# Patient Record
Sex: Male | Born: 1935 | Race: White | Hispanic: No | Smoking: Never smoker
Health system: Southern US, Community
[De-identification: ages and names within clinical notes are randomized; demographics above are authoritative.]

## PROBLEM LIST (undated history)

## (undated) DIAGNOSIS — I5022 Chronic systolic (congestive) heart failure: Secondary | ICD-10-CM

## (undated) DIAGNOSIS — I428 Other cardiomyopathies: Secondary | ICD-10-CM

## (undated) DIAGNOSIS — F32A Depression, unspecified: Secondary | ICD-10-CM

## (undated) DIAGNOSIS — E669 Obesity, unspecified: Secondary | ICD-10-CM

## (undated) DIAGNOSIS — M1990 Unspecified osteoarthritis, unspecified site: Secondary | ICD-10-CM

## (undated) DIAGNOSIS — T82110A Breakdown (mechanical) of cardiac electrode, initial encounter: Secondary | ICD-10-CM

## (undated) DIAGNOSIS — K2211 Ulcer of esophagus with bleeding: Secondary | ICD-10-CM

## (undated) DIAGNOSIS — K219 Gastro-esophageal reflux disease without esophagitis: Secondary | ICD-10-CM

## (undated) DIAGNOSIS — D126 Benign neoplasm of colon, unspecified: Secondary | ICD-10-CM

## (undated) DIAGNOSIS — D3610 Benign neoplasm of peripheral nerves and autonomic nervous system, unspecified: Secondary | ICD-10-CM

## (undated) DIAGNOSIS — E662 Morbid (severe) obesity with alveolar hypoventilation: Secondary | ICD-10-CM

## (undated) DIAGNOSIS — K269 Duodenal ulcer, unspecified as acute or chronic, without hemorrhage or perforation: Secondary | ICD-10-CM

## (undated) DIAGNOSIS — F329 Major depressive disorder, single episode, unspecified: Secondary | ICD-10-CM

## (undated) HISTORY — DX: Morbid (severe) obesity with alveolar hypoventilation: E66.2

## (undated) HISTORY — DX: Major depressive disorder, single episode, unspecified: F32.9

## (undated) HISTORY — DX: Chronic systolic (congestive) heart failure: I50.22

## (undated) HISTORY — PX: CARDIAC DEFIBRILLATOR PLACEMENT: SHX171

## (undated) HISTORY — DX: Gastro-esophageal reflux disease without esophagitis: K21.9

## (undated) HISTORY — PX: OTHER SURGICAL HISTORY: SHX169

## (undated) HISTORY — DX: Depression, unspecified: F32.A

## (undated) HISTORY — DX: Benign neoplasm of peripheral nerves and autonomic nervous system, unspecified: D36.10

## (undated) HISTORY — DX: Unspecified osteoarthritis, unspecified site: M19.90

## (undated) HISTORY — DX: Obesity, unspecified: E66.9

## (undated) HISTORY — DX: Breakdown (mechanical) of cardiac electrode, initial encounter: T82.110A

## (undated) HISTORY — DX: Other cardiomyopathies: I42.8

---
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
## 2002-02-04 DIAGNOSIS — D126 Benign neoplasm of colon, unspecified: Secondary | ICD-10-CM

## 2002-02-04 HISTORY — DX: Benign neoplasm of colon, unspecified: D12.6

## 2002-07-26 ENCOUNTER — Encounter: Payer: Self-pay | Admitting: Urology

## 2002-07-26 ENCOUNTER — Ambulatory Visit (HOSPITAL_BASED_OUTPATIENT_CLINIC_OR_DEPARTMENT_OTHER): Admission: RE | Admit: 2002-07-26 | Discharge: 2002-07-26 | Payer: Self-pay | Admitting: Urology

## 2003-03-10 ENCOUNTER — Ambulatory Visit (HOSPITAL_COMMUNITY): Admission: RE | Admit: 2003-03-10 | Discharge: 2003-03-10 | Payer: Self-pay | Admitting: Internal Medicine

## 2004-01-31 ENCOUNTER — Ambulatory Visit (HOSPITAL_COMMUNITY): Admission: RE | Admit: 2004-01-31 | Discharge: 2004-01-31 | Payer: Self-pay | Admitting: *Deleted

## 2004-06-27 ENCOUNTER — Ambulatory Visit: Payer: Self-pay | Admitting: Internal Medicine

## 2005-09-13 ENCOUNTER — Inpatient Hospital Stay (HOSPITAL_COMMUNITY): Admission: RE | Admit: 2005-09-13 | Discharge: 2005-09-15 | Payer: Self-pay | Admitting: Cardiology

## 2007-12-07 ENCOUNTER — Inpatient Hospital Stay (HOSPITAL_COMMUNITY): Admission: RE | Admit: 2007-12-07 | Discharge: 2007-12-08 | Payer: Self-pay | Admitting: Cardiology

## 2010-02-24 ENCOUNTER — Encounter: Payer: Self-pay | Admitting: Internal Medicine

## 2010-03-01 ENCOUNTER — Observation Stay (HOSPITAL_COMMUNITY)
Admission: EM | Admit: 2010-03-01 | Discharge: 2010-03-03 | Payer: Self-pay | Source: Home / Self Care | Attending: Internal Medicine | Admitting: Internal Medicine

## 2010-03-01 LAB — COMPREHENSIVE METABOLIC PANEL
ALT: 24 U/L (ref 0–53)
AST: 23 U/L (ref 0–37)
Albumin: 3.5 g/dL (ref 3.5–5.2)
Alkaline Phosphatase: 154 U/L — ABNORMAL HIGH (ref 39–117)
BUN: 16 mg/dL (ref 6–23)
CO2: 25 mEq/L (ref 19–32)
Calcium: 8.3 mg/dL — ABNORMAL LOW (ref 8.4–10.5)
Chloride: 102 mEq/L (ref 96–112)
Creatinine, Ser: 0.89 mg/dL (ref 0.4–1.5)
GFR calc Af Amer: >60 mL/min (ref >60)
GFR calc non Af Amer: >60 mL/min (ref >60)
Glucose, Bld: 122 mg/dL — ABNORMAL HIGH (ref 70–99)
Potassium: 4.2 mEq/L (ref 3.5–5.1)
Sodium: 136 mEq/L (ref 135–145)
Total Bilirubin: 1.6 mg/dL — ABNORMAL HIGH (ref 0.3–1.2)
Total Protein: 6.7 g/dL (ref 6.0–8.3)

## 2010-03-01 LAB — CARDIAC PANEL(CRET KIN+CKTOT+MB+TROPI)
CK, MB: 1.3 ng/mL (ref 0.3–4.0)
Relative Index: INVALID (ref 0.0–2.5)
Total CK: 66 U/L (ref 7–232)
Troponin I: 0.01 ng/mL (ref 0.00–0.06)

## 2010-03-01 LAB — URINE MICROSCOPIC-ADD ON

## 2010-03-01 LAB — CK TOTAL AND CKMB (NOT AT ARMC)
CK, MB: 1.7 ng/mL (ref 0.3–4.0)
Relative Index: INVALID (ref 0.0–2.5)
Relative Index: INVALID (ref 0.0–2.5)
Total CK: 43 U/L (ref 7–232)
Total CK: 42 U/L (ref 7–232)

## 2010-03-01 LAB — CBC
HCT: 43.9 % (ref 39.0–52.0)
Hemoglobin: 14.7 g/dL (ref 13.0–17.0)
MCH: 29.9 pg (ref 26.0–34.0)
MCV: 89.2 fL (ref 78.0–100.0)
Platelets: 214 10*3/uL (ref 150–400)
RBC: 4.92 MIL/uL (ref 4.22–5.81)
RDW: 13.7 % (ref 11.5–15.5)
WBC: 11.2 10*3/uL — ABNORMAL HIGH (ref 4.0–10.5)

## 2010-03-01 LAB — URINALYSIS, ROUTINE W REFLEX MICROSCOPIC
Bilirubin Urine: NEGATIVE
Leukocytes, UA: NEGATIVE
Nitrite: NEGATIVE
Protein, ur: NEGATIVE mg/dL
Specific Gravity, Urine: 1.021 (ref 1.005–1.030)
Urine Glucose, Fasting: NEGATIVE mg/dL
Urobilinogen, UA: 2 mg/dL — ABNORMAL HIGH (ref 0.0–1.0)
pH: 7 (ref 5.0–8.0)

## 2010-03-01 LAB — DIFFERENTIAL
Basophils Absolute: 0 10*3/uL (ref 0.0–0.1)
Basophils Relative: 0 % (ref 0–1)
Eosinophils Absolute: 0.1 10*3/uL (ref 0.0–0.7)
Eosinophils Relative: 1 % (ref 0–5)
Lymphocytes Relative: 8 % — ABNORMAL LOW (ref 12–46)
Monocytes Absolute: 0.6 10*3/uL (ref 0.1–1.0)
Monocytes Relative: 6 % (ref 3–12)
Neutro Abs: 9.6 10*3/uL — ABNORMAL HIGH (ref 1.7–7.7)
Neutrophils Relative %: 86 % — ABNORMAL HIGH (ref 43–77)

## 2010-03-01 LAB — TROPONIN I
Troponin I: 0.02 ng/mL (ref 0.00–0.06)
Troponin I: 0.02 ng/mL (ref 0.00–0.06)

## 2010-03-01 LAB — BRAIN NATRIURETIC PEPTIDE: Pro B Natriuretic peptide (BNP): 320 pg/mL — ABNORMAL HIGH (ref 0.0–100.0)

## 2010-03-02 LAB — COMPREHENSIVE METABOLIC PANEL
ALT: 20 U/L (ref 0–53)
AST: 18 U/L (ref 0–37)
Alkaline Phosphatase: 132 U/L — ABNORMAL HIGH (ref 39–117)
BUN: 14 mg/dL (ref 6–23)
CO2: 26 mEq/L (ref 19–32)
Calcium: 8.2 mg/dL — ABNORMAL LOW (ref 8.4–10.5)
Chloride: 96 mEq/L (ref 96–112)
Creatinine, Ser: 0.89 mg/dL (ref 0.4–1.5)
GFR calc Af Amer: >60 mL/min (ref >60)
GFR calc non Af Amer: >60 mL/min (ref >60)
Glucose, Bld: 100 mg/dL — ABNORMAL HIGH (ref 70–99)
Potassium: 3.8 mEq/L (ref 3.5–5.1)
Sodium: 135 mEq/L (ref 135–145)
Total Bilirubin: 2.4 mg/dL — ABNORMAL HIGH (ref 0.3–1.2)
Total Protein: 6.6 g/dL (ref 6.0–8.3)

## 2010-03-02 LAB — URINE CULTURE
Colony Count: NO GROWTH
Culture  Setup Time: 201201261537
Culture: NO GROWTH

## 2010-03-02 LAB — CBC
HCT: 42.7 % (ref 39.0–52.0)
Hemoglobin: 14.3 g/dL (ref 13.0–17.0)
MCH: 29.5 pg (ref 26.0–34.0)
MCHC: 33.5 g/dL (ref 30.0–36.0)
MCV: 88.2 fL (ref 78.0–100.0)
RBC: 4.84 MIL/uL (ref 4.22–5.81)
RDW: 13.6 % (ref 11.5–15.5)
WBC: 10.5 10*3/uL (ref 4.0–10.5)

## 2010-03-02 LAB — CARDIAC PANEL(CRET KIN+CKTOT+MB+TROPI)
CK, MB: 1.2 ng/mL (ref 0.3–4.0)
Relative Index: 1.2 (ref 0.0–2.5)
Total CK: 103 U/L (ref 7–232)
Troponin I: 0.01 ng/mL (ref 0.00–0.06)

## 2010-03-02 NOTE — H&P (Signed)
[name], [name]             ACCOUNT NO.:  [id number]  MEDICAL RECORD NO.:  [id number]          PATIENT TYPE:  EMS  LOCATION:  MAJO                         FACILITY:  [facility]  PHYSICIAN:  Edythe Lynn, M.D.       DATE OF BIRTH:  1933/11/08  DATE OF ADMISSION:  03/01/2010 DATE OF DISCHARGE:                             HISTORY & PHYSICAL   PRIMARY CARE PHYSICIAN:  Janifer Adie, M.D.  PRIMARY CARDIOLOGIST:  Barnett Abu, M.D.  CHIEF COMPLAINT:  Abdominal pain and chest pain.  HISTORY OF PRESENT ILLNESS:  [name] is a 77 year old gentleman with a history of nonischemic cardiomyopathy and myasthenia gravis on chronic cyclosporine, who presented to the emergency room today with the complaints of ongoing chest pain and abdominal pain.  He says the symptoms have started a few days back.  He is a little bit vague about the timing of the symptoms.  He does says his abdominal pain is about 10/10 diffuse in his abdomen and it has been last now for 4 days. Today, he was having more chest pain in the retrosternal area, epigastric area that currently  respond to nitroglycerin in the emergency room.  He has had a cardiac catheterization in 2005 that basically showed clean coronaries.  He currently says that the chest pain is pretty much subsided, but he still has a lot of abdominal discomfort.  He does not think it is cancer or constipation.  His only surgical procedure was appendectomy as a child.  PAST MEDICAL HISTORY: 1. Obesity. 2. Nonischemic cardiomyopathy, ejection fraction 15-20%. 3. Chronic systolic congestive heart failure, status post     biventricular pacer with AICD in August 2007. 4. Myasthenia gravis. 5. Left bundle branch block.  SOCIAL HISTORY:  The patient is a widower.  His son is a physician in [place], his phone number is [phone].  The patient does smoke and does not drink alcohol, is independent, lives all by himself here  in [place].  FAMILY HISTORY:  Positive for heart disease.  REVIEW OF SYSTEMS:  As per HPI.  The patient denies nausea, vomiting, or diarrhea.  He denies fevers and chills.  Denies headaches, focal weakness, or numbness.  Otherwise per HPI or negative.  PHYSICAL EXAMINATION:  VITAL SIGNS:  Upon admission, temperature is 98.2, blood pressure 117/55, respiration 19, saturation 96% on room air. GENERAL APPEARANCE:  He is one of a moderately obese gentleman.  He does not seem to be in acute distress, sitting in the chair.  He is clammy, diaphoretic with cold extremities. HEENT:  Head is normocephalic, atraumatic.  Eyes, pupils equal, round, and reactive to light and accommodation.  Extraocular movements intact. Throat clear. NECK:  Supple.  No JVD. CHEST:  Clear without wheeze, rhonchi, or crackles. HEART:  Regular rate and rhythm with no murmurs, rubs, or gallops. ABDOMEN:  Obese and soft with diffuse tenderness.  No rebound.  No guarding.  Bowel sounds are diminished. EXTREMITIES:  Lower extremities without edema. SKIN:  Pale and clammy.  LABORATORY DATA:  On admission, white blood cell count is 11.2, hemoglobin 14.7, and platelet count 214.  CK 43, troponin-I  0.02. Sodium 136, potassium 4.2, chloride 102, bicarbonate 25, BUN 16, creatinine 0.8, and glucose 122.  AST 23 and ALT 24.  Total bilirubin is at 1.6, alkaline phosphatase is 154, lipase is 14.  Urinalysis shows nitrite, leukocyte esterase negative, rare bacteria, 3-6 red blood cells,  and 0-2 white blood cells.  IMAGING STUDIES:  Chest x-ray shows cardiomegaly without edema.  Left basilar atelectasis, small left effusion.  ASSESSMENT AND PLAN:  This is a 77 year old gentleman with a complex past medical history of nonischemic cardiomyopathy, myasthenia gravis on immunosuppressants, presenting with chest pain and abdominal pain of unclear etiology.  His workup so far is not pointing towards a clear explanation for his  problems.  It is obvious that he has not suffered a myocardial infarction with his pain lasting 4 days now and with normal troponins.  My plan is to place him on observation on telemetry unit, continue to cycle a couple of more cardiac enzymes, but focus mainly on his abdominal discomfort as the source for his ailment.  He has a history of gastritis and he probably wants to be on a proton pump inhibitor, we have listed to that treatment right away.  He could also have an episode of cholecystitis given the fact that he has mild elevation in total bilirubin and alk phosphatase in this pain.  The reason could be masked by the fact that he is on chronic immunosuppressants.  Our plan is to place him on observation and obtain a stat abdominal and pelvic CT with IV and oral contrast.  I will continue his home medication with exception of the ACE inhibitor until his situation is more clear.     Edythe Lynn, M.D.     SL/MEDQ  D:  03/01/2010  T:  03/01/2010  Job:  [id number]  cc:   Janifer Adie, M.D.  Electronically Signed by Edythe Lynn M.D. on 03/02/2010 02:14:22 PM

## 2010-03-03 NOTE — Consult Note (Signed)
[name], Holloway             ACCOUNT NO.:  [id number]  MEDICAL RECORD NO.:  [id number]          PATIENT TYPE:  OBV  LOCATION:  5128                         FACILITY:  [facility]  PHYSICIAN:  Shaun Pain, MD      DATE OF BIRTH:  1933/05/17  DATE OF CONSULTATION:  03/01/2010 DATE OF DISCHARGE:                                CONSULTATION   CARDIOLOGIST:  Former patient of Dr. [name], currently Dr. [name].  PRIMARY PHYSICIAN:  Used to be Dr. Barney Drain.  REQUESTING PHYSICIAN:  Joyice Faster. Cornett, MD  REASON FOR CONSULTATION:  Evaluation of preoperative risk in the setting of known nonischemic cardiomyopathy/acute cholecystitis.  HISTORY OF PRESENT ILLNESS:  A 77 year old male with nonischemic cardiomyopathy, prior ejection fraction of 15% to 20% with Medtronic biventricular ICD and most recent MUGA scan of 47% in 2009 showing improved EF, here for abdominal discomfort consistent with acute cholecystitis.  I was asked to evaluate his preoperative risk.  He denies any chest Holloway, syncope, bleeding.  He has mild shortness of breath with exertion which is chronic for him.  No recent increases.  At home, he takes Coreg 25 mg twice a day as well as lisinopril 10 mg twice a day.  His BNP was slightly elevated at 320 and cardiac biomarkers are negative.  Creatinine is stable at 0.89 and his white count is slightly elevated at 11.2 with normal hemoglobin of 14.  His last visit with Dr. [name] was on October 31, 2009.  He was compensated at that time also.  On telemetry, he did have a 9-beat run of nonsustained ventricular tachycardia, but asymptomatic.  He does have a biventricular ICD once again.  PAST MEDICAL HISTORY: 1. Nonischemic cardiomyopathy, confirmed by cardiac catheterization     with original ejection fraction in the 15% to 20% range most     recently, however, 47% after biventricular ICD placement,     Medtronic.  Currently, well compensated. 2.  Obesity. 3. Hypertension. 4. History of myasthenia gravis. 5. Chronic systolic heart failure, well compensated. 6. Prior appendectomy as a child.  FAMILY HISTORY:  He has a family history of coronary artery disease but currently noncontributory.  SOCIAL HISTORY:  He is a widower.  Son is a physician in [place], phone number is [phone].  He smokes, does not drink alcohol, independent, lives by himself here in [place].  ALLERGIES:  PENICILLINS.  MEDICATIONS AT HOME: 1. Coreg 25 mg twice a day. 2. Lisinopril 10 mg twice a day. 3. Cyclosporin for myasthenia gravis. 4. Aspirin 325 mg. 5. Prilosec 20 mg a day.  REVIEW OF SYSTEMS:  Positive for abdominal Holloway.  No melena.  No hematemesis.  No skin changes.  No rashes.  No orthopnea.  No chest Holloway.  Unless specified above, all other 12 review of systems negative.  PHYSICAL EXAMINATION:  VITAL SIGNS:  Blood pressure originally 162/99 and currently 126/77, sating 99% on room air.  He is afebrile 97.5, pulses 70s, respirations 19. GENERAL:  Alert and oriented x3, overweight male in bed with mild abdominal discomfort.  No shortness of breath. EYES:  Well-perfused conjunctivae.  EOMI.  No scleral icterus. NECK:  Supple.  No lymphadenopathy.  Thick.  No carotid bruits appreciated. CARDIOVASCULAR:  Regular rate and rhythm with occasional ectopy, difficult to appreciate any murmurs or palpate PMI. LUNGS:  Clear to auscultation bilaterally.  Normal respiratory effort. ABDOMEN:  Mild right upper quadrant tenderness.  Positive bowel sounds. No bruits. EXTREMITIES:  No clubbing, cyanosis, or edema noted.  Palpable distal pulses. GU:  Deferred. RECTAL:  Deferred. NEUROLOGIC:  Nonfocal.  No tremors are noted. SKIN:  Warm, dry, and intact.  No rashes.  DATA:  Lab work reviewed as above.  Prior medical records reviewed.  EKG shows ventricular pacing with occasional ectopic beats, heart rate of 70.  CT abdomen and pelvis was  demonstrative of acute cholecystitis. Chest x-ray showed left small effusion, cardiomegaly without edema.  ASSESSMENT AND PLAN:  A 77 year old male with nonischemic cardiomyopathy, most recent ejection fraction of 47% in 2009 with implantable cardioverter-defibrillator, obesity, hypertension, here with acute cholecystitis. 1. Preoperative risk assessment - based upon his prior cardiovascular     history with nonischemic cardiomyopathy which appears to be     currently well compensated with no evidence of any angina, I do     believe that he is of mild to low moderate risk for general     anesthesia/surgery.  I would proceed to OR for cholecystectomy.  He     has shown some signs of ventricular ectopy/nonsustained ventricular     tachycardia on telemetry and he is currently taking Coreg 25 mg     b.i.d.  He does have a defibrillator in place for protection.  No     recent discharges.  Watch IV fluids.  Do not overhydrate.  I have     discussed with him his risk and he understands.  Of course if     surgery is required urgently or emergently, one may proceed to OR     immediately.  I have discussed the case with Dr. Josetta Huddle     physician assistant. 2. Status post biventricular implantable cardioverter-defibrillator -     Medtronic - stable. 3. Obesity - weight loss. 4. Myasthenia gravis - Anesthesiology should make a note of this.     Watch for any signs of respiratory depression. 5. Acute cholecystitis - per primary team.  Cipro.     Shaun Pain, MD     MCS/MEDQ  D:  03/02/2010  T:  03/03/2010  Job:  [id number]  Electronically Signed by [name] MD on 03/03/2010 01:06:33 PM

## 2010-03-04 NOTE — Op Note (Signed)
[name], [name]             ACCOUNT NO.:  [id number]  MEDICAL RECORD NO.:  [id number]          PATIENT TYPE:  OBV  LOCATION:  5128                         FACILITY:  [facility]  PHYSICIAN:  Marcello Moores A. Cornett, M.D.DATE OF BIRTH:  1933/07/12  DATE OF PROCEDURE:  03/02/2010 DATE OF DISCHARGE:                              OPERATIVE REPORT   PREOPERATIVE DIAGNOSIS:  Acute cholecystitis.  POSTOPERATIVE DIAGNOSIS:  Acute cholecystitis.  PROCEDURE:  Laparoscopic cholecystectomy with intraoperative cholangiogram.  SURGEON:  Thomas A. Cornett, MD  ANESTHESIA:  General endotracheal anesthesia with 0.25% Sensorcaine, local.  ASSISTANT:  Imogene Burn. Tsuei, MD  ESTIMATED BLOOD LOSS:  50 mL.  SPECIMEN:  Gallbladder in pieces, necrotic with large gallstone to Pathology.  DRAINS:  None.  INDICATIONS FOR PROCEDURE:  The patient is a 77 year old male admitted yesterday to the medical service for abdominal pain and chest pain. Workup showed acute cholecystitis.  We are consulted and actually cardiologist saw him to clear preoperatively for surgery since he had acute cholecystitis.  They did and we proceeded today with surgical management of acute cholecystitis.  He was felt to be safe operative candidate.  I think it should be the best way to help and get better.  After informed consent was obtained, we discussed risks of bleeding, infection, bile duct injury, open procedure, organ injury, infection, colon injury, small bowel injury, liver injury, abdominal wall injury, and intraabdominal abscess.  He voiced understanding of the above potential complications and wished to proceed.  DESCRIPTION OF PROCEDURE:  The patient was brought to the operating room and placed supine.  After induction of anesthesia, the abdomen was prepped and draped in the sterile fashion.  A 1-cm infraumbilical incision was made.  Dissection was carried down to the fascia.  The fascia was identified over the  midline, and a purse-string suture of 0 Vicryl was placed.  A 12-mm Hasson cannula was placed under direct vision.  Pneumoperitoneum was created to 15 mmHg of CO2.  Laparoscope was placed.  No evidence of bowel injury with insertion of this port. He was placed in reversed Trendelenburg and rolled to his left.  At this point in time, we placed an 11-mm subxiphoid port and two 5-mm ports in the right lower quadrant.  The gallbladder had acute cholecystitis.  We decompressed the gallbladder with a needle.  We were able to grab and retract the patient's right shoulder.  Second grasper was used to grab the infundibulum and pulled to the patient's right lower quadrant.  We then dissected out the cystic duct.  Cystic duct was the only tubular structure in the gallbladder.  Clip was placed in the gallbladder side of this.  A small incision was made.  Through a separate stab incision, a Cook cholangiogram catheter was introduced to place in there. Operative cholangiogram was done using one half-strength Hypaque dye. Free flow of contrast down the cystic duct, common bile duct, and duodenum.  Free flow of contrast down the common hepatic duct and right and left hepatic ducts.  No signs of stricture, stone extravasation.  We removed the catheter and placed 3 clips across the cystic duct, stump  divided.  Cystic artery was divided between clips as well.  There was a large posterior branch and cystic artery in the gallbladder bed that were divided between clips.  We then excised the gallbladder out of the gallbladder bed using cautery.  We placed the gallbladder in Endocatch bag.  Gallbladder bed was made hemostatic by cautery as well as Surgicel.  We irrigated the abdominal cavity out.  Four-quadrant laparoscopy was performed which showed no signs of injury to bowel or other intraabdominal organ.  The gallbladder was extracted out the umbilicus.  We had to enlarge the incision because the stone was  about 4- 5 cm.  As we got the stone on the top of the fascia, the bag broke with some of the subcu tissues, but we were able to extract the stone and gallbladder in its entirety as well.  The bag with no fragments.  We irrigated out the subcu tissues with copious amounts of saline. Intraabdominal examination with laparoscope revealed no retained stone fragments, gallbladder fragments, or bag fragments.  We reexamined the gallbladder bed, found to be hemostatic, and suctioned out any excess irrigation.  We then removed our ports under direct vision on the CO2 to escape.  Umbilical fascia was closed with 0 Vicryl.  I irrigated this out and closed it with 4-0 Monocryl, but left the bottom open and placed small piece of packing since there was some issues getting the gallbladder out and the subcu tissues.  Remainder of the skin incisions were closed with 4-0 Monocryl.  Dermabond was applied.  All final counts of sponge, needle, and instrument were found to be correct in this portion of the case.  The patient was extubated and taken to the recovery in satisfactory condition.  All final counts of sponge, needle, and instruments were found to be correct.     Thomas A. Cornett, M.D.     TAC/MEDQ  D:  03/02/2010  T:  03/03/2010  Job:  [id number]  Electronically Signed by Erroll Luna M.D. on 03/04/2010 12:34:20 AM

## 2010-03-10 NOTE — Discharge Summary (Signed)
[name], [name]             ACCOUNT NO.:  [id number]  MEDICAL RECORD NO.:  [id number]          PATIENT TYPE:  OBV  LOCATION:  [id number]                         FACILITY:  [facility]  PHYSICIAN:  Hosie Poisson, MD       DATE OF BIRTH:  [birth year]  DATE OF ADMISSION:  03/01/2010 DATE OF DISCHARGE:  03/03/2010                              DISCHARGE SUMMARY   PRIMARY CARE PHYSICIAN:  Janifer Adie, MD  PRIMARY CARDIOLOGIST:  Barnett Abu, MD  DISCHARGE DIAGNOSES: 1. Acute cholecystitis, status post cholecystectomy. 2. Obesity. 3. Nonischemic cardiomyopathy with ejection fraction of 20%, status     post biventricular automatic implantable cardioverter defibrillator     with pacemaker. 4. Myasthenia gravis.  DISCHARGE MEDICATIONS: 1. Zofran 4 mg p.o. q.6 h. for 3 days. 2. Tylenol as needed. 3. Oxycodone/APAP 5/325 mg 1-2 tablets p.o. q.4 h. for 0.5 days. 4. Aspirin 325 mg daily p.o. 5. Coreg 25 mg p.o. b.i.d. 6. Lisinopril 10 mg p.o. daily. 7. Multivitamin 1 tab daily. 8. Cyclosporin 25 mg 2 caps b.i.d. 9. Ciprofloxacin 500 mg b.i.d. for 5 days.  CONSULTS CALLED:  Surgery consult, cardiology consult.  PROCEDURES DONE:  Laparoscopic cholecystectomy was done.  PERTINENT LABS:  CBC on March 01, 2010 showing a WBC count of 11.2, hemoglobin of 14.7, hematocrit of 43.9, platelets of 214, CK-MB of 1.7, sodium of 136, potassium of 4.2, chloride of 102, bicarb of 25, glucose of 122, BUN of 16, creatinine of 0.89, total bilirubin of 1.6.  Alkaline phosphatase of 154, AST of 23, ALT of 24, lipase of 14.  Troponin was 0.02.  Urinalysis showed small blood, negative nitrates, and leukocytes. Another set of cardiac enzymes were negative.  BNP was 320.  Two more sets of cardiac enzymes negative.  Urine culture no growth.  RADIOLOGY:  The patient had a chest x-ray, it showed left basilar atelectasis, small left effusion, cardiomegaly without edema.  CT abdomen/pelvis with contrast  shows a 4 cm gallstone with mild pericholecystic inflammation highly suggestive of acute cholecystitis, nonobstructing, right renal calculi, moderate hiatal hernia.  BRIEF HOSPITAL COURSE:  This is a 77 year old gentleman with a history of nonischemic cardiomyopathy, myasthenia gravis on chronic cyclosporine presented with abdominal pain and with a chest discomfort, was found to have acute cholecystitis on CT abdomen.  Surgery consult was called. The patient was started on IV antibiotics and the patient was prepped up for lap cholecystectomy surgery.  Surgery consult was called and he was prepped for a lap cholecystectomy.  The patient before undergoing the procedure got a cardiology consult to clear him for the surgery.  The patient was cleared for the surgery and he underwent a lap cholecystectomy.  After the procedure, his AICD was switched on, and after the procedure, we counseled about the discharge.  From the surgical point of view, the patient was stable to be discharged on March 03, 2010.  On the day of discharge, the patient's vitals; 97.6, pulse rate of 67 per minute, respirations of 16 per minute, blood pressure of 137/80, saturating 95% on room air.  PHYSICAL EXAMINATION:  GENERAL:  He was alert, afebrile,  oriented x3. CARDIOVASCULAR:  S1, S2 heard. RESPIRATORY:  Good air entry bilaterally. ABDOMEN:  Soft, nontender, nondistended.  Good bowel sounds. EXTREMITIES:  No pedal edema.  The patient was hemodynamically stable for discharge home to continue with ciprofloxacin, to complete the course of antibiotic in 5 days, and also was recommended to follow up with his cardiologist in 1-2 weeks after discharge.  TIME SPENT WITH THE PATIENT ON DISCHARGE:  25 minutes          ______________________________ Hosie Poisson, MD     VA/MEDQ  D:  03/04/2010  T:  03/05/2010  Job:  [id number]  Electronically Signed by Hosie Poisson MD on 03/10/2010 11:36:37 PM

## 2010-03-13 NOTE — Consult Note (Signed)
NAMEHARRELL, [name]             ACCOUNT NO.:  [id number]  MEDICAL RECORD NO.:  [id number]          PATIENT TYPE:  INP  LOCATION:  [place]                         FACILITY:  [facility]  PHYSICIAN:  Marcello Moores A. Avanna Sowder, M.D.DATE OF BIRTH:  [birth year]  DATE OF CONSULTATION: DATE OF DISCHARGE:                                CONSULTATION   CHIEF COMPLAINT:  Abdominal and chest pain.  REFERRING PHYSICIAN:  Fosston, MD  PRIMARY CARE PHYSICIAN:  Chaney Born, MD, Family Practice.  CARDIOLOGY:  Barnett Abu, MD  HISTORY:  The patient is a 77 year old [place] gentleman who presents with diffuse abdominal pain mostly in the right upper quadrant some times going up into the chest.  He has had symptoms for 3-4 days.  He has not been eating for the last two.  He was seen in the ER and evaluated.  CT scan shows a 4-cm gallstone along with pericholecystic fluid and mild gallbladder thickening.  He has had some nausea, some dryheaves.  He has chronic constipation, but no diarrhea.  No blood in his stool.  We are asked to see the patient in consultation.  Chest x-ray shows left basilar atelectasis and a small effusion.  CT scan also revealed a moderate hiatal hernia.  He has a 1 mm and a 3 mm right renal calculi, which is nonobstructing and record shows it goes back around 2004.  Currently, the patient is still tender.  He is afebrile and we have been asked to evaluate for acute cholecystitis.  PAST MEDICAL HISTORY: 1. Nonischemic cardiomyopathy with an EF of 15-20% by cath, on 12/27     by Dr. Jaci Standard.  Coronaries were normal. 2. Chronic left bundle branch block. 3. Nonischemic cardiomyopathy with Medtronics AICD placement in 2007     and later revision in 2009. 4. Myasthenia gravis, on chronic cyclosporin. 5. Renal calculi. 6. Obesity.  The patient is 75 inches, 240 pounds with a BMI of 32.5.  PAST SURGICAL HISTORY: 1. AICD and revision of lead 2007, 2009. 2. Spinal tumor removal  in 2007, it was benign.  FAMILY HISTORY:  Father died of pneumonia and tobacco use.  Mother died at 40.  One brother deceased from blood clot.  No sisters.  SOCIAL HISTORY:  Tobacco:  None.  Alcohol none.  Drugs none.  He is a widower.  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  REVIEW OF SYSTEMS:  CV:  Negative for headache, dizziness, syncope, presyncope, stroke or seizure.  FEVER:  None.  SKIN:  No changes. PSYCH:  He has some problems with anxiety and depression has never been treated.  PULMONARY:  No orthopnea.  No PND.  Some dyspnea on exertion with at least a block.  He has had a recent upper respiratory tract infection, currently on no treatment.  GI:  Positive for GERD, positive for nausea and dry heaves.  No actual vomiting.  Positive for constipation.  No diarrhea.  No blood in the stool.  GU:  No trouble voiding.  LOWER EXTREMITIES:  Positive for lower extremity edema.  No claudication.  MUSCULOSKELETAL:  He has  chronic knee and foot pain with ambulation.  MEDICATIONS: 1. Coreg 25 mg b.i.d. 2. Lisinopril 10 mg b.i.d. 3. Cyclosporin. 4. Aspirin 325 mg b.i.d. 5. Prilosec 20 mg daily.  ALLERGIES:  None.  PHYSICAL EXAMINATION:  GENERAL:  This is a well-nourished, well- developed white male, overweight, no acute distress. VITAL SIGNS:  Temperature is 97.5, heart rate is 70, respiratory rate is 19, blood pressure is 162/99, sats are 99% on room air.  He is 108 kg, 72 inches.  He has been treated so far with nitroglycerin, GI cocktail and aspirin.  LABS:  White count is 11.2, hemoglobin is 14, hematocrit is 43, platelets are 214,000.  Sodium is 136, potassium is 4.2, chloride is 102, CO2 is 25, BUN 16, creatinine 0.89, glucose is 122, bilirubin is 1.6, alk phos 154, SGOT is 23, SGPT is 24.  Lipase was 14.  CK was 42, CK-MB is 1.3.  Troponin was 0.02.  BNP was 320.  Urinalysis showed 0-2 white cells, small amount of blood and ketones.  CT scan as  noted above showed 4-cm gallstone, mild pericholecystic inflammation highly suggestive of cholecystitis.  Liver, spleen, renal, adrenal pancreas were unremarkable.  There is a 1-cm nonobstructing mid right renal calculus and a 3 mm nonobstructing right upper pole calculus.  Chest x- ray showed cardiomegaly with no edema.  There is some left basilar atelectasis with small left effusion.  IMPRESSION: 1. Cholelithiasis and cholecystitis. 2. History of cardiomyopathy, EF of 15-20%, last MUGA shows 47%, on     medications. 3. Myasthenia gravis, on immunosuppression therapy. 4. Obesity with a BMI of 32.5.  PLAN:  We are going to try him on some liquids, start him on some IV Cipro.  Cardiology favors not pushing any fluids at this time.  We have noted the patient has not been able to eat for the last couple of days. We will keep him n.p.o. after midnight and reevaluate for possible surgery in the a.m.     Lydia Guiles, P.A.   ______________________________ Joyice Faster Timofey Carandang, M.D.    WDJ/MEDQ  D:  03/01/2010  T:  03/01/2010  Job:  [id number]  cc:   Barnett Abu, M.D.  Electronically Signed by Earnstine Regal P.A. on 03/07/2010 11:00:42 AM Electronically Signed by Erroll Luna M.D. on 03/13/2010 06:46:11 PM

## 2010-06-19 NOTE — Op Note (Signed)
[name], [name]             ACCOUNT NO.:  [id number]   MEDICAL RECORD NO.:  [id number]          PATIENT TYPE:  INP   LOCATION:  6527                         FACILITY:  [facility]   PHYSICIAN:  Barnett Abu, M.D.  DATE OF BIRTH:  Mar 19, 1933   DATE OF PROCEDURE:  12/07/2007  DATE OF DISCHARGE:                               OPERATIVE REPORT   PROCEDURES PERFORMED:  1. Explant old implantable cardioverter-defibrillator generator.  2. Insert new right ventricular pace/stent/shock lead.  3. Left subclavian venogram.  4. Defibrillation threshold testing.   INDICATIONS:  [name] is a pleasant 77 year old man who is now  slightly over 2 years F/P insertion of an ICD, biventricular implant  device for treatment of nonischemic cardiomyopathy and severe CHF.  He  had a Medtronic [id number] lead placed which has now failed.  He is brought to  the Catheterization Laboratory at this time to have a revision of this  lead - replacement with the intent of using the old pacing generator at  completion.   PROCEDURE NOTE:  The patient was brought to the Cardiac Catheterization  Laboratory in a fasting state.  The left prepectoral region was prepped  and draped in the usual sterile fashion.  Local anesthesia was obtained  with infiltration of 1% lidocaine.  This was throughout the left  prepectoral region.  A left subclavian venogram was then performed with  a peripheral injection of 20 mL of Omnipaque.  The venogram did  demonstrate the vein to be widely patent and coursing in a normal  fashion over the anterior surface of the first rib and beneath the  middle third of the clavicle.  There was no evidence for persistence of  left superior vena cava.  A 7-8 cm incision was then made over the old  ICD incision site and this was carried down by sharp and blunt  dissection to the pacemaker capsule.  The capsule was incised and the  device delivered from the pocket.  The leads were then detached from  the  generator and it was placed in the antibiotic solution.  The lead  testing of the left ventricular lead and the right atrial lead was  conducted.  Results are per below.  The left subclavian vein was then  punctured using an 18-gauge thin-wall needle through which was passed a  0.038-inch guidewire.  Over this wire, a long 9-French tearaway sheath  and dilator were advanced.  The dilator was removed and the right  ventricular lead was advanced to the level of the right atrium.  Using  standard technique and fluoroscopic landmarks, the lead was manipulated  into the right ventricular apex.  This was an active fixation lead and  the screw was advanced as appropriate.  Adequate pacing parameters were  obtained as would be noted below.  It was tested for diaphragmatic  pacing at 10 volts and none was found.  The lead was then sutured into  place after removing the tearaway sheath with 3 separate 0 silk  ligatures.  The remaining previous paced shock lead was capped in 3  separate places and tied  off with 0 silk.  This was placed within the  pocket.  The leads were then attached to the pace shock generator  carefully identifying each by its serial number and placing each into  the appropriate receptacle under the observation of the Medtronic  representative.  Each lead was tightened into place and tested for  security.  The leads were then round beneath the pace shock generator  and this was placed in the pocket.  The pocket had previously been  copiously irrigated using 1% gentamicin solution.  The pocket was then  closed using 2-0 Vicryl in a running fashion with subcutaneous layer.  Two layers were applied.  The skin was approximated using 4-0 Vicryl in  a running subcuticular fashion.  We then prepared for defibrillation  threshold testing.  After obtaining adequate moderate sedation with  midazolam and fentanyl 5 mg and 100 mcg respectively, we proceeded with  threshold testing.   Ventricular fibrillation was induced by the shock on  T technique.  Ventricular fibrillation was induced and promptly  detected.  The device delivered a 20-joule shock with prompt return of  sinus rhythm and no dropouts.  The shocking impedance was 500 ohms.  The  duration was 10 seconds from induction to termination.   Steri-Strips and sterile dressing were placed on the wound and the  patient was transported to the recovery area in stable condition and  awakening from his sedation.   EQUIPMENT DATA:  The device generator is a Medtronic Macopin, model  A999333, serial number [id number].  The new pace shock lead is a Personnel officer, serial number [id number].   PACING DATA:  The atrial lead detected a 3.8 mV P-wave.  The pacing  threshold was 0.8 volts at 0.5 milliseconds pulse width.  The impedance  was 505 ohms resulting in a current at capture threshold of 1.2 MA.  The  left ventricular lead had a threshold of 2.1 volts at 0.5 milliseconds  pulse width.  The impedance was 1305 ohms resulting in a current at  capture threshold of 1.3 MA.  The right ventricular lead detected an 8.5  mV R wave.  The threshold was 0.7 volts at 1.3 milliseconds pulse width.  The impedance was 572 ohms resulting in a current at capture threshold  of 1.2 MA.      Barnett Abu, M.D.  Electronically Signed     JHE/MEDQ  D:  12/07/2007  T:  12/08/2007  Job:  [id number]

## 2010-06-22 NOTE — Op Note (Signed)
NAMEAMAHRI, BONGO             ACCOUNT NO.:  [id number]   MEDICAL RECORD NO.:  [id number]          PATIENT TYPE:  INP   LOCATION:  2807                         FACILITY:  [facility]   PHYSICIAN:  Barnett Abu, M.D.  DATE OF BIRTH:  Jan 16, 1934   DATE OF PROCEDURE:  09/13/2005  DATE OF DISCHARGE:                                 OPERATIVE REPORT   PROCEDURES PERFORMED:  1. Left subclavian venogram.  2. Coronary sinus venogram.  3. Insertion dual-chamber ICD with biventricular pacemaker.  4. Defibrillation threshold testing.   INDICATIONS:  Mr. Azazel Ezernack is a 77 year old man with in a nonischemic  cardiomyopathy.  His EF is approximately 25%.  He displays NYHA class II to  III symptoms of exertional dyspnea.  He does have a baseline left bundle  branch block on ECG.  He is brought to the catheterization laboratory at  this time for placement of prophylactic ICD under the Scud-Heft criteria and  the placement of a therapeutic biventricular device.   PROCEDURE NOTE:  The patient was brought to cardiac catheterization  laboratory in fasting state.  The left prepectoral region was prepped and  draped in usual sterile fashion.  A left subclavian venogram was performed  with a peripheral injection of 20 mL of Omnipaque.  A digital cine angiogram  was obtained in the AP projection and road mapped to guide future left  subclavian puncture.  The venogram did demonstrate the left subclavian vein  to be widely patent and coursing in a normal fashion over the anterior  surface of the first rib and beneath the middle third of the clavicle.  There was no evidence for persistence of the left superior vena cava.   A 7 to 8 cm incision was then made in the deltopectoral groove.  Following  infiltration with 1% lidocaine with epinephrine throughout the left  prepectoral region.  This was carried down by sharp dissection and  electrocautery to the prepectoral fascia.  There a plane was lifted  and a  pocket formed inferiorly and medially using electrocautery and blunt  dissection.  The pocket was then packed with 1% kanamycin soaked gauze.  Three separate left subclavian punctures were then performed using an 18-  gauge thin-wall needle through which was passed a 0.038-inch tight J  guidewire.  Over the initial guidewire, 7-French tearaway sheath and dilator  were advanced.  The dilator and wire were removed and the ventricular lead  was advanced to the level of the right atrium.  The sheath was then torn  away.  Using standard technique and fluoroscopic landmarks, the lead was  manipulated into the right ventricular apex.  There excellent pacing  parameters were obtained as will be noted below.  This was an active  fixation lead and the screw was advanced as appropriate.  The lead was  tested for diaphragmatic pacing at 10 volts and none was found.  The lead  was then sutured into place using three separate 0 silk ligatures.  Over the  second guidewire another 7-French tearaway sheath and dilator were advanced.  The dilator and wire were removed and  the atrial lead was advanced to the  level of the right atrium and the sheath was torn away.  Using standard  technique and fluoroscopic landmarks, the lead was manipulated into the  right atrial appendage.  This was an active fixation lead and the screw was  advanced as appropriate.  The lead was tested for diaphragmatic pacing at 10  volts.  Adequate pacing parameters were obtained and this is reported below.  The lead was then sutured into place using three separate silk ligatures.  We then proceeded with left ventricular lead placement.  A Medtronic  multipurpose 6216guiding catheter was advanced into the right atrium over a  dilator and long wire.  The dilator was removed.  Using AP fluoroscopic  landmarks in the LAO projection and by advancing the guidewire while  torquing the catheter counterclockwise, I was eventually able to  cannulate  the coronary sinus.  The guide catheter was advanced deeply into the sinus.  A hand injection was performed of contrast which did not reveal any  significant left lateral veins.  A balloon occlusion catheter was then  advanced through the guiding catheter and this was positioned midway between  the left lateral and the origin of the coronary sinus.  The balloon was  inflated and the cine angiogram performed in RAO and LAO projections.  This  did reveal a left lateral vein which arose quite posteriorly.  I then spent  an extensive amount of time attempting to cannulate this vein using free  wire  both Luge and Pro-waters.  I also attempted to cannulate using the  subselective guiders both straight and angulated produced by Medtronic.  These efforts were unsuccessful.  The origin of this vein was not entirely  clear and the guiding catheter did not allow for good positioning of the  wire.  Therefore, I abandoned further attempts at this and then advanced the  wire distally into an anterior lateral vein.  This passed easily.  This was  the Luge wire.  Over this Luge wire advanced the Medtronic [id number] bipolar LV  lead without difficulty into a position such that adequate pacing parameters  were obtained.  This was noted below.  A guiding catheter was then removed  by the slit technique and the lead was sutured into place through the using  three separate 0 silk ligatures.  It was tested for chest wall and  diaphragmatic pacing and none was found at 10 volts.  The kanamycin soaked  gauze was removed from the pocket and the pocket was copiously irrigated  using 1% kanamycin solution.  The leads were then attached to the pace shock  generator carefully identifying each by its serial number placing each in  the appropriate receptacle under the guidance of the Medtronic  representative.  Each lead was tightened into place and tested for security. The leads were then wound beneath the pace  shock generator and the generator  was placed in the pocket.   We then proceeded with defibrillation threshold testing.  The patient  received a total of 10 mg of midazolam, 2 mg of hydromorphone and 75 mg of  fentanyl.  Adequate anesthesia was obtained and the airways protected by the  respiratory therapist.  Ventricular fibrillation was induced by shock on T  technique.  The ventricular fibrillation was promptly induced and detected.  The charge time was 7.3 seconds.  20 joules were delivered at 50 ohms  impedance with successful termination to sinus rhythm without dropout.  I  then proceeded to close the pocket using 2-0 Vicryl in running fashion for  the subcutaneous layer.  Two layers were applied.  After 5 minutes we again  induced ventricular fibrillation by the shock on T technique.  The  arrhythmia was promptly detected.  The device charged for 6.8 seconds and  delivered a 20 joules shock at 50 ohms impedance again with successful  termination to sinus rhythm and no dropouts.   The skin was then approximated using 4-0 Vicryl in a running subcuticular  fashion.  I did copiously irrigate the wound both with and without the  device in place using the kanamycin solution.  This was prior to closure of  the wound.   The patient was then transported to recovery area in stable condition in A  sense biventricular paced mode.   EQUIPMENT DATA:  Pace shock generator is a Medtronic Concerto 0000000,  serial number [id number].  The atrial lead is Medtronic model number [id number],  serial number [id number].  The right ventricular lead is Medtronic model  number [id number], serial number [id number].  The left ventricular lead is  Medtronic model number [id number], serial number [id number].   PACING DATA:  The atrial lead detected a 5.2 mV P-wave.  The pacing  threshold was 0.6 volts at 0.5 milliseconds pulse width.  The impedance was  669 ohms resulting in a current at capture threshold of 1.2 MA.   The right  ventricular lead detected a 19.4 mV R wave.  The pacing threshold 0.7 volts  at 0.5 milliseconds pulse width.  The impedance was 844 ohms resulting in a  current at capture threshold of 1.0 MA.  The left ventricular lead detected  a  9.3 mV R wave.  This was using the LV ring to the RV coil unipolar  configuration.  The pacing threshold was 1.5 volts at 0.5 milliseconds pulse  width.  The impedance was 575 ohms resulting in a current at capture  threshold of 3.3 MA.      Barnett Abu, M.D.  Electronically Signed     JHE/MEDQ  D:  09/13/2005  T:  09/13/2005  Job:  [id number]   cc:   Janifer Adie, M.D.

## 2010-06-22 NOTE — Discharge Summary (Signed)
NAMEABDO, [name]             ACCOUNT NO.:  [id number]   MEDICAL RECORD NO.:  [id number]          PATIENT TYPE:  INP   LOCATION:  [id number]                         FACILITY:  [facility]   PHYSICIAN:  Barnett Abu, M.D.  DATE OF BIRTH:  10/29/1933   DATE OF ADMISSION:  09/13/2005  DATE OF DISCHARGE:  09/15/2005                                 DISCHARGE SUMMARY   DISCHARGE DIAGNOSES:  1. Ischemic cardiomyopathy, EF 15-20% status post bi-V AICD implantation      on September 13, 2005 under the care of Dr. Rodell Perna.  2. Left bundle branch block, wide QRS.  3. Sleep apnea.  4. Compensated congestive heart failure. Currently New York Heart      Association class II, dyspnea.  5. Obesity.  6. Status post appendectomy.  7. Status post tumor removal approximately 10 years ago.  8. Long-term medication use.  9. Seasonal allergies.   [name] is a 77 year old male patient with a nonischemic cardiomyopathy  with an EF around 15-20% despite maximal medication management. He continues  to be quite short of breath and follows the New York Heart Association class  II to III pattern. He met with Dr. [name], and the patient was counseled  once again to undergo replacement of prophylactic AICD with cardiac  resynchronization, and the patient accepted the idea to have this performed.   On September 13, 2005 a bi-V AICD was implanted with indications of LV  dysfunction, CHF, and a left bundle branch block. The patient tolerated the  procedure well and remained in the hospital until September 15, 2005. Was  discharged home in stable condition on the following medications.   DISCHARGE MEDICATIONS:  1. Hydrocodone p.r.n. pain.  2. The patient is to otherwise remain on the same home medications as      prior to admission which include cyclosporine 25 mg b.i.d., Zyrtec      daily, Centrum Silver daily, aspirin daily, Prevacid 30 mg a day, Coreg      12.5 mg b.i.d., Lasix 40 mg a day p.r.n., and  lisinopril 20 mg b.i.d.   The patient is not to drive until seen in the office. No lifting for 4-6  weeks with left arm. He did receive pacer discharge/activity instructions.  The patient is to be seen in the office in 10-14 days by Dr. [name]. He is  call for this appointment.      Joesphine Bare, P.A.      Barnett Abu, M.D.  Electronically Signed    LB/MEDQ  D:  11/25/2005  T:  11/26/2005  Job:  [id number]   cc:   Janifer Adie, M.D.

## 2010-06-22 NOTE — Discharge Summary (Signed)
[name], [name]             ACCOUNT NO.:  [id number]   MEDICAL RECORD NO.:  [id number]          PATIENT TYPE:  INP   LOCATION:  6527                         FACILITY:  [facility]   PHYSICIAN:  Barnett Abu, M.D.  DATE OF BIRTH:  [birth year]   DATE OF ADMISSION:  12/07/2007  DATE OF DISCHARGE:  12/08/2007                               DISCHARGE SUMMARY   DISCHARGE DIAGNOSES:  1. Nonischemic cardiomyopathy with explantation of implantable      cardioverter-defibrillator generator with new right ventricular      pacer/shock lead.  2. Obesity.  3. Obstructive sleep apnea, continuous positive airway pressure.  4. Myasthenia gravis.  5. Gastritis.  6. Degenerative joint disease, both knees.  7. Depression.   HOSPITAL COURSE:  [name] is a 77 year old male patient who has  had an AICD BiV implantation for nonischemic cardiomyopathy.  He has  felt well and has not experienced any ICD discharges.  Due to a faulty  lead, he has to undergo lead revision.  The patient voiced understanding  of the indications and was willing to proceed.   This took place on December 07, 2007, a new RV pacer/shock lead was  implanted without difficulty.  The patient remained in the hospital  overnight.  Final chest x-ray showed no pneumothorax, and the patient  was discharged home.   DISCHARGE MEDICATIONS:  1. Prilosec 20 mg daily.  2. Lisinopril 20 mg a day.  3. Atacand 16 mg 1/2 tablet daily.  4. Cyclosporine 25 mg t.i.d.  5. Lasix 40 mg daily.  6. Baby aspirin 81 mg a day.  7. Carvedilol 25 mg twice a day.  8. Physostigmine 60 mg 1/2 tablet t.i.d.  9. Vicodin p.r.n. chest pain.   Activity and wound care as per instruction sheet.  The patient is to  remain on a low-sodium heart-healthy diet.   FOLLOWUP:  Follow up with Dr. Joycelyn Schmid, NP, on December 22, 2007, at 1 p.m.      [name], P.A.      Barnett Abu, M.D.  Electronically Signed    LB/MEDQ  D:   01/27/2008  T:  01/28/2008  Job:  [id number]

## 2010-06-22 NOTE — Cardiovascular Report (Signed)
NAMEALEXADER, [name]             ACCOUNT NO.:  [id number]   MEDICAL RECORD NO.:  [id number]          PATIENT TYPE:  OIB   LOCATION:  2899                         FACILITY:  [facility]   PHYSICIAN:  Fabio Asa, M.D.    DATE OF BIRTH:  [birth year]   DATE OF PROCEDURE:  DATE OF DISCHARGE:                              CARDIAC CATHETERIZATION   REFERRING PHYSICIAN:  Dr. Drucie Ip.   INDICATION FOR STUDY:  A 77 year old male with increasing dyspnea, recently  new left bundle branch block, Adenosine Cardiolite revealing ejection  fraction of 14%.   PROCEDURE:  After obtaining written informed consent the patient was brought  to the cardiac catheterization laboratory in the postabsorptive state.  Preoperative sedation was achieved using Versed 1 mg IV.  The right groin  was prepped and draped in the usual sterile fashion.  Local anesthesia was  achieved with 1% Xylocaine.  There was difficulty with accessing the right  femoral artery secondary to the patient's morbid abdominal obesity.  The  right femoral artery was accessed using the Smart needle.  There was a  requirement of two Smart needles as the first was nonfunctioning.  A 6-  [place] hemostasis sheath was placed into the right femoral artery.  Selective coronary angiography was performed using a JL4 and a JR4 Judkins  catheter.  A Feldman catheter had to be utilized to cross the aortic valve  secondary to the marked tortuosity of the patient's aorta.  The Lyman Speller was  then exchanged for a 6-French pigtail curved catheter.  Left heart  catheterization was performed.  Single plane ventriculogram was performed in  the RAO position using a 6-French pigtail curved catheter.  There was no  immediate complications.  The patient was transferred to the holding area.  Hemostasis was achieved using Fem-Stop device.   FINDINGS:  The aortic pressure was 112/74.  LV pressure was 116/19.  ED  pressure was 23.  Single plane ventriculogram reveals  severe diffuse  hypokinesis.  Ejection fraction is 15-20%.   CORONARY ANGIOGRAPHY:  The left main coronary artery trifurcates into the  LAD, ramus, and circumflex vessel.  There is no disease noted in the left  main coronary artery.   Left anterior descending is a large caliber vessel.  Gives rise to a large  bifurcating D1.  Goes on to end as an apical branch.  There is luminal  irregularities noted in the left anterior descending only.   The ramus vessel is a large caliber vessel.  There is no disease noted in  the ramus vessel.   Circumflex vessel is a moderate sized vessel.  Gives rise to a large  bifurcating OM1 and goes on to end as an AV groove vessel.  There is no  disease noted in the circumflex or its branches.   The right coronary artery is dominant for the posterior circulation giving  rise to a PDA and a PL branch.  There is no disease noted in the right  coronary artery.   FINAL IMPRESSION:  Nonischemic cardiomyopathy.  Will continue with medical  treatment.  The patient currently is on lisinopril  at 5 mg b.i.d. and Lasix  40 mg daily.  Will hope to achieve reducing his Lasix so that we may  increase his lisinopril and add Coreg.  His systolic blood pressure at this  time will not tolerate further adjustments in  medications.  Will need to repeat his transthoracic echocardiogram in three  months.  If there is no improvement in his ejection fraction the patient  should be considered for AICD implantation and possibly biventricular pacing  in view of his left bundle.      Hele   HP/MEDQ  D:  01/31/2004  T:  01/31/2004  Job:  [id number]

## 2010-07-24 ENCOUNTER — Other Ambulatory Visit: Payer: Self-pay | Admitting: Family Medicine

## 2010-07-24 ENCOUNTER — Ambulatory Visit
Admission: RE | Admit: 2010-07-24 | Discharge: 2010-07-24 | Disposition: A | Discharge status: Home / Self Care | Payer: Medicare Other | Source: Ambulatory Visit | Attending: Family Medicine | Admitting: Family Medicine

## 2010-07-24 DIAGNOSIS — J40 Bronchitis, not specified as acute or chronic: Secondary | ICD-10-CM

## 2010-09-26 ENCOUNTER — Encounter: Payer: Self-pay | Admitting: Internal Medicine

## 2010-11-20 ENCOUNTER — Encounter: Payer: Self-pay | Admitting: Internal Medicine

## 2011-05-16 ENCOUNTER — Encounter: Payer: Self-pay | Admitting: Internal Medicine

## 2011-05-16 ENCOUNTER — Encounter (HOSPITAL_COMMUNITY): Payer: Self-pay | Admitting: Pharmacy Technician

## 2011-05-16 ENCOUNTER — Encounter: Payer: Self-pay | Admitting: *Deleted

## 2011-05-16 ENCOUNTER — Ambulatory Visit (INDEPENDENT_AMBULATORY_CARE_PROVIDER_SITE_OTHER): Payer: Medicare Other | Admitting: Internal Medicine

## 2011-05-16 VITALS — BP 127/71 | HR 66 | Resp 18 | Ht 72.0 in | Wt 245.4 lb

## 2011-05-16 DIAGNOSIS — I509 Heart failure, unspecified: Secondary | ICD-10-CM

## 2011-05-16 DIAGNOSIS — I5022 Chronic systolic (congestive) heart failure: Secondary | ICD-10-CM

## 2011-05-16 DIAGNOSIS — I428 Other cardiomyopathies: Secondary | ICD-10-CM

## 2011-05-16 LAB — CBC WITH DIFFERENTIAL/PLATELET
Basophils Absolute: 0 10*3/uL (ref 0.0–0.1)
Eosinophils Relative: 6.2 % — ABNORMAL HIGH (ref 0.0–5.0)
MCV: 94 fl (ref 78.0–100.0)
Monocytes Absolute: 0.7 10*3/uL (ref 0.1–1.0)
Neutrophils Relative %: 70.4 % (ref 43.0–77.0)
Platelets: 162 10*3/uL (ref 150.0–400.0)
RDW: 13.2 % (ref 11.5–14.6)
WBC: 8.3 10*3/uL (ref 4.5–10.5)

## 2011-05-16 LAB — ICD DEVICE OBSERVATION
AL IMPEDENCE ICD: 440 Ohm
BATTERY VOLTAGE: 2.61 V
LV LEAD IMPEDENCE ICD: 544 Ohm
LV LEAD THRESHOLD: 2.5 V
RV LEAD AMPLITUDE: 7.6 mv
TOT-0002: 0
TOT-0006: 20070810000000
TZAT-0001ATACH: 1
TZAT-0001ATACH: 3
TZAT-0001SLOWVT: 1
TZAT-0002ATACH: NEGATIVE
TZAT-0002FASTVT: NEGATIVE
TZAT-0004SLOWVT: 8
TZAT-0005SLOWVT: 88 pct
TZAT-0012ATACH: 150 ms
TZAT-0012ATACH: 150 ms
TZAT-0012FASTVT: 200 ms
TZAT-0018ATACH: NEGATIVE
TZAT-0018FASTVT: NEGATIVE
TZAT-0018SLOWVT: NEGATIVE
TZAT-0019FASTVT: 8 V
TZAT-0020ATACH: 1.5 ms
TZAT-0020ATACH: 1.5 ms
TZON-0003ATACH: 350 ms
TZON-0005SLOWVT: 12
TZST-0001ATACH: 4
TZST-0001ATACH: 6
TZST-0001FASTVT: 5
TZST-0001FASTVT: 6
TZST-0001SLOWVT: 2
TZST-0001SLOWVT: 5
TZST-0001SLOWVT: 6
TZST-0002ATACH: NEGATIVE
TZST-0002ATACH: NEGATIVE
TZST-0002FASTVT: NEGATIVE
TZST-0002FASTVT: NEGATIVE
TZST-0002FASTVT: NEGATIVE
TZST-0003SLOWVT: 35 J
TZST-0003SLOWVT: 35 J
VF: 0

## 2011-05-16 LAB — BASIC METABOLIC PANEL
BUN: 21 mg/dL (ref 6–23)
Chloride: 99 mEq/L (ref 96–112)
Creatinine, Ser: 0.9 mg/dL (ref 0.4–1.5)
GFR: 83.05 mL/min (ref >60.00)
Glucose, Bld: 74 mg/dL (ref 70–99)

## 2011-05-16 NOTE — Patient Instructions (Signed)
Device generator change on 05/23/11

## 2011-05-19 ENCOUNTER — Encounter: Payer: Self-pay | Admitting: Internal Medicine

## 2011-05-19 DIAGNOSIS — I5023 Acute on chronic systolic (congestive) heart failure: Secondary | ICD-10-CM | POA: Insufficient documentation

## 2011-05-19 NOTE — Assessment & Plan Note (Signed)
The patient has a nonischemic CM with positive response to CRT.  His EF has improved from 20 to 50% and symptoms of CHF have also improved.  He has reached EOL battery status. Risks, benefits, alternatives to BiV ICD pulse generator replacement were discussed in detail with the patient today. The patient  understands that the risks include but are not limited to bleeding and infection.  If lead revision is required, additional risks include pneumothorax, perforation, tamponade, vascular damage, renal failure, MI, stroke, death, inappropriate shocks, and lead dislodgement.  He wishes to proceed.  We will therefore schedule device generator change at the next available time. As he has myasthenia gravis, I will not plan to perform DFT testing and hopefully minimal sedation can be used for the procedure.

## 2011-05-19 NOTE — Progress Notes (Signed)
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   The patient reports doing very well since having a MDT BiV ICD implanted 11/11/05 and remains very active despite his age.  His EF has improved to 50% and CHF has also significantly improved with CRT.  Today, he  denies symptoms of palpitations, chest pain, shortness of breath, orthopnea, PND, lower extremity edema, dizziness, presyncope, syncope, or neurologic sequela.  The patientis tolerating medications without difficulties and is otherwise without complaint today.   Past Medical History  Diagnosis Date  . Chronic systolic congestive heart failure   . Nonischemic cardiomyopathy     EF improved from 20% to 50% with CRT  . Myasthenia gravis     mild  . Pickwickian syndrome   . Obesity   . DJD (degenerative joint disease)   . Depression   . Schwannoma     rested at [facility]   Past Surgical History  Procedure Date  . Cardiac defibrillator placement 09/13/05    MDT BiV ICD implanted by Dr [name]  . Schwannoma resection     removed at [facility]    History   Social History  . Marital Status: Divorced    Spouse Name: N/A    Number of Children: N/A  . Years of Education: N/A   Occupational History  . Not on file.   Social History Main Topics  . Smoking status: Never Smoker   . Smokeless tobacco: Not on file  . Alcohol Use: No  . Drug Use: No  . Sexually Active: Not on file   Other Topics Concern  . Not on file   Social History Narrative  . No narrative on file    Allergies  Allergen Reactions  . Penicillins Hives    Current Outpatient Prescriptions  Medication Sig Dispense Refill  . aspirin 325 MG tablet Take 325 mg by mouth 2 (two) times daily.       . carvedilol (COREG) 12.5 MG tablet Take 12.5 mg by mouth 2 (two) times daily with a meal.       .  cycloSPORINE modified (GENGRAF) 25 MG capsule Take 25 mg by mouth 2 (two) times daily.      . furosemide (LASIX) 40 MG tablet Take 40 mg by mouth 2 (two) times a week. For edema      . lisinopril (PRINIVIL,ZESTRIL) 10 MG tablet Take 10 mg by mouth 2 (two) times daily.       . Multiple Vitamins-Minerals (CENTRUM SPECIALIST HEART PO) Take 1 tablet by mouth 2 (two) times daily.      Marland Kitchen NASONEX 50 MCG/ACT nasal spray Place 2 sprays into the nose as needed. For seasonal allergies      . ibuprofen (ADVIL,MOTRIN) 200 MG tablet Take 200 mg by mouth every 6 (six) hours as needed. For pain        ROS- all systems are reviewed and negative except as per HPI  Physical Exam: Filed Vitals:   05/16/11 1216  BP: 127/71  Pulse: 66  Resp: 18  Height: 6' (1.829 m)  Weight: 245 lb 6.4 oz (111.313 kg)    GEN- The patient is overweight appearing, alert and oriented x 3 today.   Head- normocephalic, atraumatic Eyes-  Sclera clear, conjunctiva pink Ears- hearing intact Oropharynx- clear Neck- supple, no JVP Lymph-  no cervical lymphadenopathy Lungs- Clear to ausculation bilaterally, normal work of breathing Chest- ICD pocket is well healed Heart- Regular rate and rhythm, no murmurs, rubs or gallops, PMI not laterally displaced GI- soft, NT, ND, + BS Extremities- no clubbing, cyanosis, or edema  Pacemaker interrogation- reviewed in detail today,  See PACEART report  Assessment and Plan:

## 2011-05-23 ENCOUNTER — Ambulatory Visit (HOSPITAL_COMMUNITY)
Admission: RE | Admit: 2011-05-23 | Discharge: 2011-05-23 | Disposition: A | Discharge status: Home / Self Care | Payer: Medicare Other | Source: Ambulatory Visit | Attending: Internal Medicine | Admitting: Internal Medicine

## 2011-05-23 ENCOUNTER — Encounter (HOSPITAL_COMMUNITY)
Admission: RE | Disposition: A | Discharge status: Home / Self Care | Payer: Self-pay | Source: Ambulatory Visit | Attending: Internal Medicine

## 2011-05-23 DIAGNOSIS — I5023 Acute on chronic systolic (congestive) heart failure: Secondary | ICD-10-CM | POA: Insufficient documentation

## 2011-05-23 DIAGNOSIS — I428 Other cardiomyopathies: Secondary | ICD-10-CM

## 2011-05-23 DIAGNOSIS — E669 Obesity, unspecified: Secondary | ICD-10-CM | POA: Insufficient documentation

## 2011-05-23 DIAGNOSIS — I509 Heart failure, unspecified: Secondary | ICD-10-CM | POA: Insufficient documentation

## 2011-05-23 DIAGNOSIS — Z4502 Encounter for adjustment and management of automatic implantable cardiac defibrillator: Secondary | ICD-10-CM | POA: Insufficient documentation

## 2011-05-23 DIAGNOSIS — I5022 Chronic systolic (congestive) heart failure: Secondary | ICD-10-CM | POA: Insufficient documentation

## 2011-05-23 HISTORY — PX: BIV ICD GENERTAOR CHANGE OUT: SHX5745

## 2011-05-23 LAB — SURGICAL PCR SCREEN
MRSA, PCR: NEGATIVE
Staphylococcus aureus: NEGATIVE

## 2011-05-23 SURGERY — BIV ICD GENERTAOR CHANGE OUT
Anesthesia: LOCAL | Laterality: Left

## 2011-05-23 MED ORDER — SODIUM CHLORIDE 0.9 % IV SOLN
INTRAVENOUS | Status: DC
  Administered 2011-05-23: 07:00:00 via INTRAVENOUS

## 2011-05-23 MED ORDER — ACETAMINOPHEN 325 MG PO TABS: 325.0000 mg | ORAL_TABLET | ORAL | Status: DC | PRN

## 2011-05-23 MED ORDER — SODIUM CHLORIDE 0.9 % IR SOLN
80.0000 mg | Status: DC
  Filled 2011-05-23: qty 2

## 2011-05-23 MED ORDER — MUPIROCIN 2 % EX OINT
TOPICAL_OINTMENT | CUTANEOUS | Status: AC
  Administered 2011-05-23: 1
  Filled 2011-05-23: qty 22

## 2011-05-23 MED ORDER — HYDROCODONE-ACETAMINOPHEN 5-325 MG PO TABS: 1.0000 | ORAL_TABLET | ORAL | Status: DC | PRN

## 2011-05-23 MED ORDER — ONDANSETRON HCL 4 MG/2ML IJ SOLN: 4.0000 mg | Freq: Four times a day (QID) | INTRAMUSCULAR | Status: DC | PRN

## 2011-05-23 MED ORDER — HEPARIN (PORCINE) IN NACL 2-0.9 UNIT/ML-% IJ SOLN
INTRAMUSCULAR | Status: AC
  Filled 2011-05-23: qty 1000

## 2011-05-23 MED ORDER — VANCOMYCIN HCL IN DEXTROSE 1-5 GM/200ML-% IV SOLN
INTRAVENOUS | Status: AC
  Filled 2011-05-23: qty 200

## 2011-05-23 MED ORDER — CHLORHEXIDINE GLUCONATE 4 % EX LIQD: 60.0000 mL | Freq: Once | CUTANEOUS | Status: DC

## 2011-05-23 MED ORDER — VANCOMYCIN HCL 1000 MG IV SOLR
1500.0000 mg | INTRAVENOUS | Status: DC
  Filled 2011-05-23: qty 1500

## 2011-05-23 MED ORDER — LIDOCAINE HCL (PF) 1 % IJ SOLN
INTRAMUSCULAR | Status: AC
  Filled 2011-05-23: qty 60

## 2011-05-23 NOTE — Op Note (Signed)
SURGEON:  [name], MD      PREPROCEDURE DIAGNOSES:   1. Nonischemic cardiomyopathy.   2. New York Heart Association class III, heart failure chronically.   3. LBBB  4. BiV ICD at ERI     POSTPROCEDURE DIAGNOSES:   1. Nonischemic cardiomyopathy.   2. New York Heart Association class III, heart failure chronically.   3. LBBB  4. BiV ICD at [facility]   PROCEDURES:    1.BiV ICD pulse generator replacement without DFT testing  2. Skin pocket revision     INTRODUCTION:  [name] is a 78 y.o. male with a nonischemic CM and NYHA Class III CHF s/p appropriate BiV ICD implant who presents today for BiV ICD pulse generator replacement for ERI battery status.  He has responded to CRT with improvement in EF and CHF symptoms previously. He previously had a Sprint Fidelis lead fracture and required new RV 6947 lead placement 12/06/08.  He has done well since that time.  He therefore presents today for BiV ICD pulse generator replacement.      DESCRIPTION OF PROCEDURE:  Informed written consent was obtained and the patient was brought to the electrophysiology lab in the fasting state.  The patient was not given sedation today due to his advanced age and history of myesthenia gravis.  The patient's left chest was prepped and draped in the usual sterile fashion by the EP lab staff.  The skin overlying the left deltopectoral region was infiltrated with lidocaine for local analgesia.  A 5-cm incision was made over the existing ICD pocket.  Electrocautery was used to assure hemostasis.  The device was exposed and removed from the pocket. The device was disconnected from the leads.  The leads were examined thoroughly and their integrity confirmed to be intact.   The right atrial lead was confirmed to be a Medtronic, model [id number]  (serial # [id number]) lead implanted 09/13/05.  The right ventricular lead was confirmed to be a Medtronic, model [id number] (serial number [id number]) right ventricular defibrillator lead  implanted 12/06/08. The left ventricular lead was confirmed to be a Medtronic, model 4194 (serial number [id number]) lead implanted 09/13/05.  Atrial lead P-waves measured 2.2 mV with an impedance of 460 ohms and a threshold of 0.5 volts at 0.5 milliseconds.  The right ventricular lead R-wave measured 11 mV with impedance of 426 ohms and a threshold of 1.1 volts at 0.5 milliseconds.  Shock impedances were 57/55 Ohms. LV lead impedance 1289 Ohms with a threshold of 1.6V@0 .69msec (bipolar). Both leads were then connected to a MDT Protecta XT MOdel [id number] (SN [id number]) BiV ICD.  The pocket was revised to accomodate this new device.  The pocket was  irrigated with copious gentamicin solution.  The pocket was then closed in 2 layers with 2.0 Vicryl suture  for the subcutaneous and subcuticular layers.  Steri-Strips and a  sterile dressing were then applied.  There were no early apparent complications.     CONCLUSIONS:   1. Nonischemic cardiomyopathy with chronic New York Heart Association class III heart failure now with BiV ICD at elective replacement indicator  2. Successful BiV ICD pulse generator replacement with a Medtronic Protecta XT BiV ICD placed.  3. DFT not performed today   4. No early apparent complications.   Shaun Rinks Daschel Roughton,MD 11:42 AM 03/19/2011

## 2011-05-23 NOTE — Discharge Instructions (Signed)
Pacemaker Battery Change A pacemaker battery usually lasts 4 to 12 years. Once or twice per year, you will be asked to visit your caregiver to have a full evaluation of your pacemaker. When a battery needs to be replaced, the entire pacemaker is actually replaced so that you can benefit from new circuitry and any new features that have recently been added to pacemakers. Most often, this procedure is very simple because the leads are already in place. After giving medicine to numb the skin, your health care provider makes a cut to reopen the pocket holding the pacemaker and disconnects the old device from its leads. The leads are routinely tested at this time. If they are working okay, the new pacemaker may simply be connected to the existing leads. If there is any problem with the old lead system, it may be wise to replace the lead system while inserting the new pacemaker. There are many things that affect how long a pacemaker battery will last:  Age of the pacemaker.   Number of leads (1, 2 or 3).   Pacemaker work load. If the pacemaker is helping the heart more often, then the battery will not last as long as if the pacemaker does not need to help the heart.   Resistance of the leads. The greater the resistance, the greater the drain on the battery. This can increase as the leads get older or if one or more of the leads does not have the best contact with the heart.   Power (voltage) settings.   The health of the person's heart. If the health of the heart gets worse, then the pacemaker may have to work more often and the setting changed to accommodate these changes.  Your health care provider will be alerted to the fact that it is time to replace the battery during follow-up exams. He or she will check your pacemaker using a small table-top computer, called a programmer, and a wand. The wand is about the same size as a remote control. Your provider puts the wand on your body in the area where the  pacemaker is located. Information from the pacemaker is received about how well your heart is working and the status of the battery. It is not painful, and it usually takes just a few minutes. You will have plenty of time before the battery is fully used up to plan for replacement.  LET YOUR CAREGIVER KNOW ABOUT:   Symptoms of chest pain, trouble breathing, palpitations, lightheadedness, or feelings of an abnormal or irregular heart beat.   Allergies.   Medications taken including herbs, eye drops, over the counter medications, and creams   Use of steroids (by mouth or creams).   Possible pregnancy, if applicable.   Previous problems with anesthetics or Novocaine.   History of blood clots (thrombophlebitis).   History of bleeding or blood problems.   Surgery since your last pacemaker placement.   Other health problems.  RISKS AND COMPLICATIONS These are very uncommon but include:  Bleeding.   Bruising of the skin around where the incision was made.   Pain at the site of the incision.   Pulling apart of the skin at the incision site.   Infection.   Allergic reaction to anesthetics or medicines used during the procedure.  Diabetics may have a temporary increase in their blood sugar after any surgical procedure.  BEFORE THE PROCEDURE  Wash all of the skin around the area of the chest where the pacemaker is located.   Try to remove any loose, scaling skin. Unless advised otherwise, avoid using aspirin, ibuprofen, or naproxen for 3-4 days before the procedure. Ask your caregiver for help with any other medication adjustments before the pacemaker is replaced. Unless advised otherwise, do not eat or drink after midnight on the night before the procedure EXCEPT for drinking water and taking your medications as you normally would. AFTER THE PROCEDURE   A heart monitor and the pacemaker programmer will be used to make sure that the new pacemaker is working properly.   You can go home  after the procedure.   Your caregiver will advise you if you need to have any stitches. They will be removed 5-7 days after the procedure.  HOME CARE INSTRUCTIONS   Keep the incision clean and dry.   Unless advised otherwise, you may shower after carefully covering the incision with plastic wrap that is taped to your chest.   For the first week after the replacement, avoid stretching motions that pull at the incision site and avoid heavy exercise with the arm on the same side as the incision.   Only take over-the-counter or prescription medicines for pain, discomfort, or fever as directed by your caregiver.   Your caregiver will tell you when you will need to next test your pacemaker by telephone or when to return to the office for re-exam and/or removal of stitches, if necessary.  SEEK MEDICAL CARE IF:   You have unusual pain at the incision site that is not adequately helped by over-the-counter or prescription medicine.   There is drainage or pus from the incision site.   You develop red streaking that extends above or below the incision site.   You feel brief intermittent palpitations, lightheadedness or any symptoms that you feel might be related to your heart.  SEEK IMMEDIATE MEDICAL CARE IF:   You experience chest pain that is different than the pain at the incision site.   You experience:   Shortness of breath.   Palpitations.   Irregular heart beat.   Lightheadedness that does not go away quickly.   Fainting.   You develop a fever.   You have pain that gets worse even though you are taking pain medicine.  MAKE SURE YOU:   Understand these instructions.   Will watch your condition.   Will get help right away if you are not doing well or get worse.  Document Released: 05/01/2006 Document Revised: 01/10/2011 Document Reviewed: 08/04/2006 Exit[employer] Patient Information 2012 [employer]. 

## 2011-05-23 NOTE — H&P (View-Only) (Signed)
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   The patient reports doing very well since having a MDT BiV ICD implanted 11/11/05 and remains very active despite his age.  His EF has improved to 50% and CHF has also significantly improved with CRT.  Today, he  denies symptoms of palpitations, chest pain, shortness of breath, orthopnea, PND, lower extremity edema, dizziness, presyncope, syncope, or neurologic sequela.  The patientis tolerating medications without difficulties and is otherwise without complaint today.   Past Medical History  Diagnosis Date  . Chronic systolic congestive heart failure   . Nonischemic cardiomyopathy     EF improved from 20% to 50% with CRT  . Myasthenia gravis     mild  . Pickwickian syndrome   . Obesity   . DJD (degenerative joint disease)   . Depression   . Schwannoma     rested at [facility]   Past Surgical History  Procedure Date  . Cardiac defibrillator placement 09/13/05    MDT BiV ICD implanted by [name]  . Schwannoma resection     removed at [facility]    History   Social History  . Marital Status: Divorced    Spouse Name: N/A    Number of Children: N/A  . Years of Education: N/A   Occupational History  . Not on file.   Social History Main Topics  . Smoking status: Never Smoker   . Smokeless tobacco: Not on file  . Alcohol Use: No  . Drug Use: No  . Sexually Active: Not on file   Other Topics Concern  . Not on file   Social History Narrative  . No narrative on file    Allergies  Allergen Reactions  . Penicillins Hives    Current Outpatient Prescriptions  Medication Sig Dispense Refill  . aspirin 325 MG tablet Take 325 mg by mouth 2 (two) times daily.       . carvedilol (COREG) 12.5 MG tablet Take 12.5 mg by mouth 2 (two) times daily with a meal.       .  cycloSPORINE modified (GENGRAF) 25 MG capsule Take 25 mg by mouth 2 (two) times daily.      . furosemide (LASIX) 40 MG tablet Take 40 mg by mouth 2 (two) times a week. For edema      . lisinopril (PRINIVIL,ZESTRIL) 10 MG tablet Take 10 mg by mouth 2 (two) times daily.       . Multiple Vitamins-Minerals (CENTRUM SPECIALIST HEART PO) Take 1 tablet by mouth 2 (two) times daily.      Marland Kitchen NASONEX 50 MCG/ACT nasal spray Place 2 sprays into the nose as needed. For seasonal allergies      . ibuprofen (ADVIL,MOTRIN) 200 MG tablet Take 200 mg by mouth every 6 (six) hours as needed. For pain        ROS- all systems are reviewed and negative except as per HPI  Physical Exam: Filed Vitals:   05/16/11 1216  BP: 127/71  Pulse: 66  Resp: 18  Height: 6' (1.829 m)  Weight: 245 lb 6.4 oz (111.313 kg)    GEN- The patient is overweight appearing, alert and oriented x 3 today.   Head- normocephalic, atraumatic Eyes-  Sclera clear, conjunctiva pink Ears- hearing intact Oropharynx- clear Neck- supple, no JVP Lymph-  no cervical lymphadenopathy Lungs- Clear to ausculation bilaterally, normal work of breathing Chest- ICD pocket is well healed Heart- Regular rate and rhythm, no murmurs, rubs or gallops, PMI not laterally displaced GI- soft, NT, ND, + BS Extremities- no clubbing, cyanosis, or edema  Pacemaker interrogation- reviewed in detail today,  See PACEART report  Assessment and Plan:

## 2011-05-23 NOTE — Interval H&P Note (Signed)
History and Physical Interval Note:  05/23/2011 7:14 AM  [name]  has presented today for surgery, with the diagnosis of end of life generator  The various methods of treatment have been discussed with the patient and family. After consideration of risks, benefits and other options for treatment, the patient has consented to  Procedure(s) (LRB): BIV ICD [place] (Left) as a surgical intervention .  The patients' history has been reviewed, patient examined, no change in status, stable for surgery.  I have reviewed the patients' chart and labs.  Questions were answered to the patient's satisfaction.     [name]

## 2011-05-30 ENCOUNTER — Encounter: Payer: Self-pay | Admitting: Internal Medicine

## 2011-05-30 ENCOUNTER — Ambulatory Visit (INDEPENDENT_AMBULATORY_CARE_PROVIDER_SITE_OTHER): Payer: Medicare Other | Admitting: *Deleted

## 2011-05-30 DIAGNOSIS — I509 Heart failure, unspecified: Secondary | ICD-10-CM

## 2011-05-30 DIAGNOSIS — I428 Other cardiomyopathies: Secondary | ICD-10-CM

## 2011-05-30 LAB — ICD DEVICE OBSERVATION
AL THRESHOLD: 0.5 V
BAMS-0001: 150 {beats}/min
BATTERY VOLTAGE: 3.2165 V
FVT: 0
PACEART VT: 0
RV LEAD THRESHOLD: 1 V
TZAT-0001ATACH: 3
TZAT-0001SLOWVT: 1
TZAT-0002ATACH: NEGATIVE
TZAT-0004SLOWVT: 8
TZAT-0005SLOWVT: 88 pct
TZAT-0012ATACH: 150 ms
TZAT-0012ATACH: 150 ms
TZAT-0013SLOWVT: 3
TZAT-0018FASTVT: NEGATIVE
TZAT-0019ATACH: 6 V
TZAT-0019ATACH: 6 V
TZAT-0019FASTVT: 8 V
TZAT-0020ATACH: 1.5 ms
TZAT-0020ATACH: 1.5 ms
TZAT-0020ATACH: 1.5 ms
TZON-0003SLOWVT: 350 ms
TZON-0003VSLOWVT: 350 ms
TZON-0004SLOWVT: 16
TZST-0001ATACH: 4
TZST-0001ATACH: 5
TZST-0001ATACH: 6
TZST-0001FASTVT: 2
TZST-0001FASTVT: 6
TZST-0001SLOWVT: 2
TZST-0001SLOWVT: 4
TZST-0001SLOWVT: 5
TZST-0002ATACH: NEGATIVE
TZST-0002FASTVT: NEGATIVE
TZST-0002FASTVT: NEGATIVE
TZST-0002FASTVT: NEGATIVE
TZST-0002FASTVT: NEGATIVE
TZST-0003SLOWVT: 20 J
TZST-0003SLOWVT: 35 J
TZST-0003SLOWVT: 35 J
TZST-0003SLOWVT: 35 J
VENTRICULAR PACING ICD: 85.31 pct
VF: 0

## 2011-05-30 NOTE — Progress Notes (Signed)
ICD check

## 2011-09-04 ENCOUNTER — Ambulatory Visit (INDEPENDENT_AMBULATORY_CARE_PROVIDER_SITE_OTHER): Payer: Medicare Other | Admitting: Internal Medicine

## 2011-09-04 ENCOUNTER — Encounter: Payer: Self-pay | Admitting: Internal Medicine

## 2011-09-04 VITALS — BP 110/60 | HR 70 | Resp 18 | Ht 72.0 in | Wt 239.0 lb

## 2011-09-04 DIAGNOSIS — I428 Other cardiomyopathies: Secondary | ICD-10-CM

## 2011-09-04 DIAGNOSIS — I5022 Chronic systolic (congestive) heart failure: Secondary | ICD-10-CM

## 2011-09-04 DIAGNOSIS — I509 Heart failure, unspecified: Secondary | ICD-10-CM

## 2011-09-04 LAB — ICD DEVICE OBSERVATION
AL IMPEDENCE ICD: 475 Ohm
AL THRESHOLD: 0.5 V
BAMS-0001: 150 {beats}/min
LV LEAD IMPEDENCE ICD: 608 Ohm
LV LEAD THRESHOLD: 0.75 V
RV LEAD AMPLITUDE: 11.4 mv
RV LEAD IMPEDENCE ICD: 418 Ohm
TZAT-0001ATACH: 3
TZAT-0002ATACH: NEGATIVE
TZAT-0002ATACH: NEGATIVE
TZAT-0004SLOWVT: 8
TZAT-0005SLOWVT: 88 pct
TZAT-0012FASTVT: 170 ms
TZAT-0012SLOWVT: 170 ms
TZAT-0018ATACH: NEGATIVE
TZAT-0018FASTVT: NEGATIVE
TZAT-0019ATACH: 6 V
TZAT-0019ATACH: 6 V
TZAT-0019FASTVT: 8 V
TZAT-0020ATACH: 1.5 ms
TZAT-0020FASTVT: 1.5 ms
TZAT-0020SLOWVT: 1.5 ms
TZON-0003SLOWVT: 350 ms
TZST-0001ATACH: 5
TZST-0001FASTVT: 2
TZST-0001FASTVT: 4
TZST-0001FASTVT: 6
TZST-0001SLOWVT: 4
TZST-0001SLOWVT: 5
TZST-0002ATACH: NEGATIVE
TZST-0002ATACH: NEGATIVE
TZST-0002FASTVT: NEGATIVE
TZST-0002FASTVT: NEGATIVE
TZST-0002FASTVT: NEGATIVE
TZST-0003SLOWVT: 20 J
TZST-0003SLOWVT: 35 J
TZST-0003SLOWVT: 35 J

## 2011-09-04 NOTE — Assessment & Plan Note (Signed)
Stable/ euvolemic on exam Normal BiV ICD function See Pace Art report No changes today  carelink every 3 months Return in 1 year

## 2011-09-04 NOTE — Progress Notes (Signed)
Primary Cardiologist:SKAINS, MARK, MD  [name] is a 78 y.o. male who presents today for routine electrophysiology followup.  Since his recent generator change, the patient reports doing very well.  Today, he denies symptoms of palpitations, chest pain, shortness of breath,  lower extremity edema, dizziness, presyncope, syncope, or ICD shocks.  The patient is otherwise without complaint today.   Past Medical History  Diagnosis Date  . Chronic systolic congestive heart failure   . Nonischemic cardiomyopathy     EF improved from 20% to 50% with CRT  . Myasthenia gravis     mild  . Pickwickian syndrome   . Obesity   . DJD (degenerative joint disease)   . Depression   . Schwannoma     rested at [facility]   Past Surgical History  Procedure Date  . Cardiac defibrillator placement 09/13/05, 05/23/11    MDT BiV ICD implanted by Dr [name], Generator change by Dr [name] 05/23/11  . Schwannoma resection     removed at [facility]    Current Outpatient Prescriptions  Medication Sig Dispense Refill  . aspirin 325 MG tablet Take 325 mg by mouth 2 (two) times daily.       . carvedilol (COREG) 12.5 MG tablet Take 12.5 mg by mouth 2 (two) times daily with a meal.       . furosemide (LASIX) 40 MG tablet Take 40 mg by mouth 2 (two) times a week. For edema      . lisinopril (PRINIVIL,ZESTRIL) 10 MG tablet Take 10 mg by mouth 2 (two) times daily.       . Multiple Vitamins-Minerals (CENTRUM SPECIALIST HEART PO) Take 1 tablet by mouth 2 (two) times daily.      . cycloSPORINE modified (GENGRAF) 25 MG capsule Take 25 mg by mouth 2 (two) times daily.      Marland Kitchen ibuprofen (ADVIL,MOTRIN) 200 MG tablet Take 200 mg by mouth every 6 (six) hours as needed. For pain      . NASONEX 50 MCG/ACT nasal spray Place 2 sprays into the nose as needed. For seasonal allergies        Physical Exam: Filed Vitals:   09/04/11 1057  BP: 110/60  Pulse: 70  Resp: 18  Height: 6' (1.829 m)  Weight: 239 lb (108.41 kg)  SpO2: 93%     GEN- The patient is well appearing, alert and oriented x 3 today.   Head- normocephalic, atraumatic Eyes-  Sclera clear, conjunctiva pink Ears- hearing intact Oropharynx- clear Lungs- Clear to ausculation bilaterally, normal work of breathing Chest- ICD pocket is well healed Heart- Regular rate and rhythm, no murmurs, rubs or gallops, PMI not laterally displaced GI- soft, NT, ND, + BS Extremities- no clubbing, cyanosis, or edema  ICD interrogation- reviewed in detail today,  See PACEART report  Assessment and Plan:

## 2011-09-04 NOTE — Patient Instructions (Addendum)
Remote monitoring is used to monitor your Pacemaker of ICD from home. This monitoring reduces the number of office visits required to check your device to one time per year. It allows [place] to keep an eye on the functioning of your device to ensure it is working properly. You are scheduled for a device check from home on December 09, 2011. You may send your transmission at any time that day. If you have a wireless device, the transmission will be sent automatically. After your physician reviews your transmission, you will receive a postcard with your next transmission date.  Your physician wants you to follow-up in: 1 year with [name].  You will receive a reminder letter in the mail two months in advance. If you don't receive a letter, please call our office to schedule the follow-up appointment.

## 2011-12-09 ENCOUNTER — Encounter: Payer: Medicare Other | Admitting: *Deleted

## 2011-12-17 ENCOUNTER — Encounter: Payer: Self-pay | Admitting: *Deleted

## 2012-01-30 ENCOUNTER — Encounter: Payer: Self-pay | Admitting: *Deleted

## 2012-03-25 ENCOUNTER — Telehealth: Payer: Self-pay | Admitting: Internal Medicine

## 2012-03-25 ENCOUNTER — Encounter: Payer: Self-pay | Admitting: Internal Medicine

## 2012-03-25 NOTE — Telephone Encounter (Signed)
03-06-12 lmm @ 921am for pt to set up appt with device or brooke, not doing remotes per staff message from amber/mt 03-10-12 lmm @ 1131am/mt 03-25-12 sent past due letter/mt

## 2012-11-30 ENCOUNTER — Ambulatory Visit (INDEPENDENT_AMBULATORY_CARE_PROVIDER_SITE_OTHER): Payer: Medicare Other | Admitting: Cardiology

## 2012-11-30 ENCOUNTER — Encounter: Payer: Self-pay | Admitting: Cardiology

## 2012-11-30 VITALS — BP 116/76 | HR 72 | Ht 72.0 in | Wt 236.0 lb

## 2012-11-30 DIAGNOSIS — Z79899 Other long term (current) drug therapy: Secondary | ICD-10-CM

## 2012-11-30 DIAGNOSIS — E669 Obesity, unspecified: Secondary | ICD-10-CM | POA: Insufficient documentation

## 2012-11-30 MED ORDER — LOSARTAN POTASSIUM 25 MG PO TABS: 25.0000 mg | ORAL_TABLET | Freq: Every day | ORAL | Status: DC

## 2012-11-30 NOTE — Patient Instructions (Addendum)
Your physician recommends that you schedule a follow-up appointment in 2-3 Months.  Your physician recommends that you return for lab work in 2-3 months at appt for a Bmet.  Your physician has recommended you make the following change in your medication:   1. Stop Lisinopril.  2. Start Losartan 25mg  1 tablet by mouth daily.

## 2012-11-30 NOTE — Progress Notes (Signed)
[place]. [address] [place], [place]  [address] Phone: [phone] Fax:  [phone]  Date:  11/30/2012   ID:  [name], DOB [birth year], MRN [id number]  PCP:  [name], MD   History of Present Illness: [name] is a 79 y.o. male with prior nonischemic cardiomyopathy, biventricular ICD placed in 2007, upgraded in April of 2013 by Dr. Rayann Heman with ejection fraction increased from 20% now 50% doing very well. Denies any edema, orthopnea, syncope, PND. No strokelike symptoms.  Feeling some dry cough. He went into discussion of lisinopril as possible etiology. Overactive bladder. He has not taken his Lasix in quite some time. He gets up every 45 minutes at times to go to the bathroom at night. He also states that he has some dizziness at times when walking. Feels as though the room is in motion. Gait instability, vertiginous-like symptoms.   Wt Readings from Last 3 Encounters:  11/30/12 236 lb (107.049 kg)  09/04/11 239 lb (108.41 kg)  05/23/11 240 lb (108.863 kg)     Past Medical History  Diagnosis Date  . Chronic systolic congestive heart failure   . Nonischemic cardiomyopathy     EF improved from 20% to 50% with CRT  . Myasthenia gravis     mild  . Pickwickian syndrome   . Obesity   . DJD (degenerative joint disease)   . Depression   . Schwannoma     rested at [facility]    Past Surgical History  Procedure Laterality Date  . Cardiac defibrillator placement  09/13/05, 05/23/11    MDT BiV ICD implanted by Dr [name], Generator change by Dr [name] 05/23/11  . Schwannoma resection      removed at [facility]    Current Outpatient Prescriptions  Medication Sig Dispense Refill  . aspirin 325 MG tablet Take 325 mg by mouth 2 (two) times daily.       . carvedilol (COREG) 12.5 MG tablet Take 12.5 mg by mouth 2 (two) times daily with a meal.       . cycloSPORINE modified (GENGRAF) 25 MG capsule Take 25 mg by mouth 2 (two) times daily.      . furosemide  (LASIX) 40 MG tablet Take 40 mg by mouth daily as needed. For edema      . ibuprofen (ADVIL,MOTRIN) 200 MG tablet Take 200 mg by mouth every 6 (six) hours as needed. For pain      . lisinopril (PRINIVIL,ZESTRIL) 10 MG tablet Take 10 mg by mouth 2 (two) times daily.       . Multiple Vitamins-Minerals (CENTRUM SPECIALIST HEART PO) Take 1 tablet by mouth 2 (two) times daily.      Marland Kitchen NASONEX 50 MCG/ACT nasal spray Place 2 sprays into the nose as needed. For seasonal allergies       No current facility-administered medications for this visit.    Allergies:    Allergies  Allergen Reactions  . Penicillins Hives    Social History:  The patient  reports that he has never smoked. He does not have any smokeless tobacco history on file. He reports that he does not drink alcohol or use illicit drugs.   ROS:  Please see the history of present illness.   Urinary frequency. Difficultly with balacne.  PHYSICAL EXAM: VS:  BP 116/76  Pulse 72  Ht 6' (1.829 m)  Wt 236 lb (107.049 kg)  BMI 32 kg/m2 Well nourished, well developed, in no acute  distress HEENT: normal Neck: no JVD Cardiac:  normal S1, S2; RRR; no murmurrare ectopy Lungs:  clear to auscultation bilaterally, no wheezing, rhonchi or rales Abd: soft, nontender, no hepatomegalyobese Ext: no edema Skin: warm and dry Neuro: no focal abnormalities noted  EKG:  Ventricular pacing, 68, occasional capture     ASSESSMENT AND PLAN:  1. Cardiomyopathy-improved EF to 50%. Excellent. Medications reviewed. 2. Dry cough-he brought in side effect profile for lisinopril and is concerned that this is causing his clearing of his throat. I will switch him from lisinopril to losartan 25 mg once a day. See him back in 2 months. 3. Gait instability-continue to encourage balance exercises. Ambulate. Utilization obtained and may be necessary. Occasional dizziness. 4. ICD-biventricular, doing well. Dr. Rayann Heman. 5. Chronic systolic left ventricular  dysfunction/heart failure-appears fairly well compensated. He states that he has not used Lasix in quite some time. Urinary incontinence has been an issue. He may wish to discuss this with a urologist/PCP.  Signed, [name], MD [facility]  11/30/2012 4:21 PM

## 2013-04-26 ENCOUNTER — Encounter: Payer: Self-pay | Admitting: *Deleted

## 2013-05-19 ENCOUNTER — Telehealth: Payer: Self-pay | Admitting: Internal Medicine

## 2013-05-19 NOTE — Telephone Encounter (Signed)
05-19-13 lmm @ 317pm to have pt call to set up past due defib ck with allred/mt

## 2013-06-07 ENCOUNTER — Encounter: Payer: Self-pay | Admitting: Cardiology

## 2013-06-07 ENCOUNTER — Other Ambulatory Visit: Payer: Self-pay | Admitting: Cardiology

## 2013-06-22 ENCOUNTER — Other Ambulatory Visit: Payer: Self-pay | Admitting: Cardiology

## 2013-07-28 ENCOUNTER — Encounter: Payer: Self-pay | Admitting: *Deleted

## 2013-08-02 ENCOUNTER — Encounter: Payer: Self-pay | Admitting: Cardiology

## 2013-08-02 ENCOUNTER — Ambulatory Visit (INDEPENDENT_AMBULATORY_CARE_PROVIDER_SITE_OTHER): Payer: Medicare Other | Admitting: Cardiology

## 2013-08-02 VITALS — BP 115/84 | HR 88 | Ht 72.0 in | Wt 218.0 lb

## 2013-08-02 DIAGNOSIS — Z95810 Presence of automatic (implantable) cardiac defibrillator: Secondary | ICD-10-CM

## 2013-08-02 DIAGNOSIS — I509 Heart failure, unspecified: Secondary | ICD-10-CM

## 2013-08-02 DIAGNOSIS — I420 Dilated cardiomyopathy: Secondary | ICD-10-CM

## 2013-08-02 DIAGNOSIS — R634 Abnormal weight loss: Secondary | ICD-10-CM

## 2013-08-02 DIAGNOSIS — I5022 Chronic systolic (congestive) heart failure: Secondary | ICD-10-CM

## 2013-08-02 DIAGNOSIS — I428 Other cardiomyopathies: Secondary | ICD-10-CM

## 2013-08-02 MED ORDER — LOSARTAN POTASSIUM 25 MG PO TABS: ORAL_TABLET | ORAL | Status: DC

## 2013-08-02 NOTE — Progress Notes (Signed)
[place]. [address], [address] [place], [place]  [address] Phone: [phone] Fax:  [phone]  Date:  08/02/2013   ID:  Farrel Gobble Orth, DOB 1933-05-04, MRN [id number]  PCP:  [name], MD   History of Present Illness: [name] is a 80 y.o. male with prior nonischemic cardiomyopathy, biventricular ICD placed in 2007, upgraded in April of 2013 by Dr. Rayann Heman with ejection fraction increased from 20% now 50% doing very well. Denies any edema, orthopnea, syncope, PND. No strokelike symptoms.  Overactive bladder. He has not taken his Lasix in quite some time. He gets up every 45 minutes at times to go to the bathroom at night. He also states that he has some dizziness at times when walking. Feels as though the room is in motion. Gait instability, vertiginous-like symptoms.  After changing into ARB, his dry cough is mildly improved however he still has the sensation of needing to clear his throat.   Wt Readings from Last 3 Encounters:  08/02/13 218 lb (98.884 kg)  11/30/12 236 lb (107.049 kg)  09/04/11 239 lb (108.41 kg)     Past Medical History  Diagnosis Date  . Chronic systolic congestive heart failure   . Nonischemic cardiomyopathy     EF improved from 20% to 50% with CRT  . Myasthenia gravis     mild  . Pickwickian syndrome   . Obesity   . DJD (degenerative joint disease)   . Depression   . Schwannoma     rested at [facility]    Past Surgical History  Procedure Laterality Date  . Cardiac defibrillator placement  09/13/05, 05/23/11    MDT BiV ICD implanted by Dr [name], Generator change by Dr [name] 05/23/11  . Schwannoma resection      removed at [facility]    Current Outpatient Prescriptions  Medication Sig Dispense Refill  . aspirin 325 MG tablet Take 325 mg by mouth 2 (two) times daily.       . carvedilol (COREG) 12.5 MG tablet take 1 tablet by mouth twice a day  60 tablet  0  . Cholecalciferol (VITAMIN D3) 2000 UNITS CHEW Chew by mouth.      .  Cholecalciferol (VITAMIN D3) 2000 UNITS TABS Take 1 tablet by mouth daily.      . cycloSPORINE modified (GENGRAF) 25 MG capsule Take 25 mg by mouth 2 (two) times daily.      Marland Kitchen ibuprofen (ADVIL,MOTRIN) 200 MG tablet Take 200 mg by mouth every 6 (six) hours as needed. For pain      . losartan (COZAAR) 25 MG tablet take 1 tablet by mouth once daily  90 tablet  1  . Multiple Vitamins-Minerals (CENTRUM SPECIALIST HEART PO) Take 1 tablet by mouth 2 (two) times daily.      Marland Kitchen NASONEX 50 MCG/ACT nasal spray Place 2 sprays into the nose as needed. For seasonal allergies       No current facility-administered medications for this visit.    Allergies:    Allergies  Allergen Reactions  . Penicillins Hives    Social History:  The patient  reports that he has never smoked. He does not have any smokeless tobacco history on file. He reports that he does not drink alcohol or use illicit drugs.   ROS:  Please see the history of present illness.   Urinary frequency. Difficultly with balacne.  PHYSICAL EXAM: VS:  BP 115/84  Pulse 88  Ht 6' (1.829 m)  [employer]  218 lb (98.884 kg)  BMI 29.56 kg/m2 Well nourished, well developed, in no acute distress HEENT: normal Neck: no JVD Cardiac:  normal S1, S2; RRR; no murmurrare ectopy Lungs:  clear to auscultation bilaterally, no wheezing, rhonchi or rales Abd: soft, nontender, no hepatomegalyobese Ext: no edema Skin: warm and dry Neuro: no focal abnormalities noted  EKG:  08/02/13-heart rate 88 beats per minute Ventricular pacing, 68, occasional capture     ASSESSMENT AND PLAN:  1. Cardiomyopathy-improved EF to 50%. Excellent. Medications reviewed. 2. Dry cough-switched him from lisinopril to losartan 25 mg once a day. Mild improvement but he still feeling this. I asked to discuss this with his primary physician. May be GERD.  3. ICD-biventricular, doing well. Dr. Rayann Heman. 4. Chronic systolic left ventricular dysfunction/heart failure-appears fairly well  compensated. He states that he has not used Lasix in quite some time (off of medication list). Urinary incontinence has been an issue. He may wish to discuss this with a urologist/PCP. 5. Decreased weight. Good Job with weight loss. He states that he does have decreased appetite. Continue to monitor this. Discuss with PCP if necessary. 6. One year follow  Signed, [name], MD [facility]  08/02/2013 1:44 PM

## 2013-08-02 NOTE — Patient Instructions (Signed)
The current medical regimen is effective;  continue present plan and medications.  Follow up in 1 year with Dr Skains.  You will receive a letter in the mail 2 months before you are due.  Please call us when you receive this letter to schedule your follow up appointment.  

## 2013-08-03 ENCOUNTER — Other Ambulatory Visit: Payer: Self-pay

## 2013-08-04 ENCOUNTER — Ambulatory Visit (INDEPENDENT_AMBULATORY_CARE_PROVIDER_SITE_OTHER): Payer: Medicare Other | Admitting: Internal Medicine

## 2013-08-04 ENCOUNTER — Ambulatory Visit (INDEPENDENT_AMBULATORY_CARE_PROVIDER_SITE_OTHER)
Admission: RE | Admit: 2013-08-04 | Discharge: 2013-08-04 | Disposition: A | Discharge status: Home / Self Care | Payer: Medicare Other | Source: Ambulatory Visit | Attending: Internal Medicine | Admitting: Internal Medicine

## 2013-08-04 ENCOUNTER — Encounter: Payer: Self-pay | Admitting: Internal Medicine

## 2013-08-04 VITALS — BP 126/82 | HR 76 | Ht 72.0 in | Wt 218.0 lb

## 2013-08-04 DIAGNOSIS — T82110A Breakdown (mechanical) of cardiac electrode, initial encounter: Secondary | ICD-10-CM

## 2013-08-04 DIAGNOSIS — I428 Other cardiomyopathies: Secondary | ICD-10-CM

## 2013-08-04 DIAGNOSIS — Z95810 Presence of automatic (implantable) cardiac defibrillator: Secondary | ICD-10-CM

## 2013-08-04 DIAGNOSIS — I5022 Chronic systolic (congestive) heart failure: Secondary | ICD-10-CM

## 2013-08-04 DIAGNOSIS — T82190A Other mechanical complication of cardiac electrode, initial encounter: Secondary | ICD-10-CM

## 2013-08-04 DIAGNOSIS — T82198A Other mechanical complication of other cardiac electronic device, initial encounter: Secondary | ICD-10-CM

## 2013-08-04 DIAGNOSIS — I509 Heart failure, unspecified: Secondary | ICD-10-CM

## 2013-08-04 DIAGNOSIS — E669 Obesity, unspecified: Secondary | ICD-10-CM

## 2013-08-04 LAB — MDC_IDC_ENUM_SESS_TYPE_INCLINIC
Battery Voltage: 3.06 V
Brady Statistic AS VP Percent: 92.14 %
Brady Statistic RA Percent Paced: 2.96 %
Date Time Interrogation Session: 20150701132830
HIGH POWER IMPEDANCE MEASURED VALUE: 1482 Ohm
HighPow Impedance: 254 Ohm
HighPow Impedance: 254 Ohm
HighPow Impedance: 4047 Ohm
Lead Channel Impedance Value: 399 Ohm
Lead Channel Impedance Value: 4047 Ohm
Lead Channel Pacing Threshold Amplitude: 1.375 V
Lead Channel Pacing Threshold Amplitude: 1.5 V
Lead Channel Pacing Threshold Pulse Width: 0.4 ms
Lead Channel Sensing Intrinsic Amplitude: 1.75 mV
Lead Channel Sensing Intrinsic Amplitude: 11.25 mV
Lead Channel Sensing Intrinsic Amplitude: 2.5 mV
Lead Channel Sensing Intrinsic Amplitude: 6 mV
Lead Channel Setting Pacing Amplitude: 2.5 V
Lead Channel Setting Pacing Pulse Width: 0.8 ms
Lead Channel Setting Sensing Sensitivity: 0.3 mV
MDC IDC MSMT LEADCHNL LV IMPEDANCE VALUE: 4047 Ohm
MDC IDC MSMT LEADCHNL LV IMPEDANCE VALUE: 931 Ohm
MDC IDC MSMT LEADCHNL LV PACING THRESHOLD PULSEWIDTH: 0.8 ms
MDC IDC MSMT LEADCHNL RA IMPEDANCE VALUE: 456 Ohm
MDC IDC MSMT LEADCHNL RA PACING THRESHOLD AMPLITUDE: 0.625 V
MDC IDC MSMT LEADCHNL RA PACING THRESHOLD PULSEWIDTH: 0.4 ms
MDC IDC SET LEADCHNL RA PACING AMPLITUDE: 2 V
MDC IDC SET LEADCHNL RV PACING AMPLITUDE: 2.5 V
MDC IDC SET LEADCHNL RV PACING PULSEWIDTH: 0.4 ms
MDC IDC STAT BRADY AP VP PERCENT: 2.94 %
MDC IDC STAT BRADY AP VS PERCENT: 0.02 %
MDC IDC STAT BRADY AS VS PERCENT: 4.9 %
MDC IDC STAT BRADY RV PERCENT PACED: 95.08 %
Zone Setting Detection Interval: 300 ms
Zone Setting Detection Interval: 350 ms
Zone Setting Detection Interval: 350 ms
Zone Setting Detection Interval: 400 ms

## 2013-08-04 NOTE — Patient Instructions (Signed)
Your physician wants you to follow-up in: 6 months with Dr Vallery Ridge will receive a reminder letter in the mail two months in advance. If you don't receive a letter, please call our office to schedule the follow-up appointment.   Remote monitoring is used to monitor your Pacemaker of ICD from home. This monitoring reduces the number of office visits required to check your device to one time per year. It allows [place] to keep an eye on the functioning of your device to ensure it is working properly. You are scheduled for a device check from home on 11/08/13. You may send your transmission at any time that day. If you have a wireless device, the transmission will be sent automatically. After your physician reviews your transmission, you will receive a postcard with your next transmission date.  A chest x-ray takes a picture of the organs and structures inside the chest, including the heart, lungs, and blood vessels. This test can show several things, including, whether the heart is enlarges; whether fluid is building up in the lungs; and whether pacemaker / defibrillator leads are still in place.   Your physician has requested that you have an echocardiogram. Echocardiography is a painless test that uses sound waves to create images of your heart. It provides your doctor with information about the size and shape of your heart and how well your heart's chambers and valves are working. This procedure takes approximately one hour. There are no restrictions for this procedure.

## 2013-08-07 ENCOUNTER — Telehealth: Payer: Self-pay | Admitting: Physician Assistant

## 2013-08-07 NOTE — Telephone Encounter (Signed)
Received a call from [employer] saying that Mr. Espy's V. fib detections were off. They also said his impedance was out of range.  Review data with Cristopher Peru. No further workup is indicated, the patient should get a followup appointment with Dr. Rayann Heman. Ms. Lynnell Jude, RN is aware, and stated one of his alerts had not been turned off. She will contact the patient and arrange a followup appointment.

## 2013-08-09 ENCOUNTER — Encounter: Payer: Self-pay | Admitting: Internal Medicine

## 2013-08-09 ENCOUNTER — Telehealth: Payer: Self-pay | Admitting: Internal Medicine

## 2013-08-09 DIAGNOSIS — T82190A Other mechanical complication of cardiac electrode, initial encounter: Secondary | ICD-10-CM | POA: Insufficient documentation

## 2013-08-09 NOTE — Telephone Encounter (Signed)
SPOKE  WITH PT ,  APPEARS AMBER   MAY   HAVE CALLED  PT   THIS  MORNING  AFTER  SPEAKING WITH   HIM . DISCUSSED WITH  MELISSA TATUM . MELISSA  WILL   GET  A  HOLD OF  AMBER    APPEARS  PT  NEEDS  AN APPT  .PT  AWARE WILL CALL  AND  MAKE   APPROPRIATE  ARRANGEMENTS./CY

## 2013-08-09 NOTE — Progress Notes (Addendum)
Primary Cardiologist:SKAINS, MARK, MD  [name] is a 80 y.o. male who presents today for routine electrophysiology followup.  I have not seen him since 2013.  Since that time, the patient reports doing reasonably well. He remains active for his age. Today, he denies symptoms of palpitations, chest pain, shortness of breath, lower extremity edema, dizziness, presyncope, syncope, or ICD shocks.  The patient is otherwise without complaint today.   Past Medical History  Diagnosis Date  . Chronic systolic congestive heart failure   . Nonischemic cardiomyopathy     EF improved from 20% to 50% with CRT  . Myasthenia gravis     mild  . Pickwickian syndrome   . Obesity   . DJD (degenerative joint disease)   . Depression   . Schwannoma     rested at [facility]  . Implantable cardioverter-defibrillator lead failure     s/p lead revision in 2010 by Dr [name],  subsequent newly placed RV defibrillator lead has fractured.  ICD therapies programmed off 6/15   Past Surgical History  Procedure Laterality Date  . Cardiac defibrillator placement  09/13/05, 12/07/2007, 05/23/11    MDT BiV ICD implanted by Dr [name], His Fidelis [phone] lead fractured and was replaced by Dr [name] 12/07/2007, Generator change by Dr [name] 05/23/11  . Schwannoma resection      removed at [facility]    Current Outpatient Prescriptions  Medication Sig Dispense Refill  . aspirin 325 MG tablet Take 325 mg by mouth 2 (two) times daily.       . carvedilol (COREG) 12.5 MG tablet take 1 tablet by mouth twice a day  60 tablet  0  . Cholecalciferol (VITAMIN D3) 2000 UNITS TABS Take 1 tablet by mouth daily.      . cycloSPORINE modified (GENGRAF) 25 MG capsule Take 25 mg by mouth 2 (two) times daily.      Marland Kitchen ibuprofen (ADVIL,MOTRIN) 200 MG tablet Take 200 mg by mouth every 6 (six) hours as needed. For pain      . losartan (COZAAR) 25 MG tablet take 1 tablet by mouth once daily  90 tablet  3  . Multiple Vitamins-Minerals (CENTRUM  SPECIALIST HEART PO) Take 1 tablet by mouth 2 (two) times daily.      Marland Kitchen NASONEX 50 MCG/ACT nasal spray Place 2 sprays into the nose as needed. For seasonal allergies       No current facility-administered medications for this visit.   ROS- all systems reviewed and negative except as per HPI above  Physical Exam: Filed Vitals:   08/04/13 0921  BP: 126/82  Pulse: 76  Height: 6' (1.829 m)  Weight: 218 lb (98.884 kg)    GEN- The patient is well appearing, alert and oriented x 3 today.   Head- normocephalic, atraumatic Eyes-  Sclera clear, conjunctiva pink Ears- hearing intact Oropharynx- clear Lungs- Clear to ausculation bilaterally, normal work of breathing Chest- ICD pocket is well healed Heart- Regular rate and rhythm, no murmurs, rubs or gallops, PMI not laterally displaced GI- soft, NT, ND, + BS Extremities- no clubbing, cyanosis, or edema  ICD interrogation- reviewed in detail today,  See PACEART report Epic records including 2007, 2009, and 2013 device OP notes are reviewed today ekg today reveals sinus with BiV pacing and occasional PVCs  Assessment and Plan:  1. Nonischemic CM/ nonischemic CM euvolemic today No medicine changes He has responded previously to BiV pacing.  He has not had a recent echo.  I will order this  today  2. ICD lead fracture The patient has new out of range impedence on his ICD HV SVC and RV shock coils today.  This is suggestive of lead fracture.  The pace/ sense portion of the lead appears to be working fine at this time.  Though his lead fracture occurred in April 13,2015 he is not followed remotely and therefore this is just picked up on today.  As his LV lead was programmed off of his RV lead this was affecting BiV pacing as well. He previously had a lead fracture (MDT [place]) in 2009 for which he had a new MDT [employer]attro [phone] lead placed.  CXR today does not reveal any obvious lead fracture.  He is noted to have 4 leads in his  heart. Today, I had a long discussion with the patient regarding options of conservative management (turning tachycardia therapies off and using his device as a biv pacemaker) as well as the option of lead revision.  Risks, benefits, and alternatives to both approaches were discussed at length.  At this time, he is very clear that given his advanced age that he does not wish to pursue lead revision.  He does not want any additional surgeries at this time if they can be avoided.  He understands that he will not have ICD shock therapy available should he have ventricular arrhythmias.  He also understands that he will continue to have CRT therapy for which he has responded very well in the past. I have therefore programmed VT/VF detections/ therapies off.  His LV lead is reprogrammed to a bipolar tip/ring configuation.  The importance of compliance with remote monitoring was also stressed today.  3. Obesity Weight loss is advised  Return to see me in 6 months Follow-up with Dr [name] as scheduled carelink monitoring is also encouraged today

## 2013-08-09 NOTE — Telephone Encounter (Signed)
New message    Patient calling stating someone call him this am did not leave a name. Returning call back

## 2013-08-11 ENCOUNTER — Telehealth: Payer: Self-pay | Admitting: Internal Medicine

## 2013-08-11 NOTE — Telephone Encounter (Signed)
New message ° ° ° ° ° °Returning a nurses call °

## 2013-08-13 ENCOUNTER — Encounter: Payer: Self-pay | Admitting: Internal Medicine

## 2013-08-18 ENCOUNTER — Ambulatory Visit (HOSPITAL_COMMUNITY): Payer: Medicare Other | Attending: Cardiovascular Disease | Admitting: Cardiology

## 2013-08-18 ENCOUNTER — Ambulatory Visit (INDEPENDENT_AMBULATORY_CARE_PROVIDER_SITE_OTHER): Payer: Medicare Other | Admitting: *Deleted

## 2013-08-18 DIAGNOSIS — I079 Rheumatic tricuspid valve disease, unspecified: Secondary | ICD-10-CM | POA: Insufficient documentation

## 2013-08-18 DIAGNOSIS — I428 Other cardiomyopathies: Secondary | ICD-10-CM | POA: Insufficient documentation

## 2013-08-18 DIAGNOSIS — E669 Obesity, unspecified: Secondary | ICD-10-CM | POA: Insufficient documentation

## 2013-08-18 DIAGNOSIS — I359 Nonrheumatic aortic valve disorder, unspecified: Secondary | ICD-10-CM | POA: Diagnosis not present

## 2013-08-18 DIAGNOSIS — I509 Heart failure, unspecified: Secondary | ICD-10-CM

## 2013-08-18 DIAGNOSIS — I517 Cardiomegaly: Secondary | ICD-10-CM | POA: Insufficient documentation

## 2013-08-18 DIAGNOSIS — Z95810 Presence of automatic (implantable) cardiac defibrillator: Secondary | ICD-10-CM

## 2013-08-18 LAB — MDC_IDC_ENUM_SESS_TYPE_INCLINIC
Lead Channel Setting Pacing Amplitude: 2 V
Lead Channel Setting Pacing Pulse Width: 0.4 ms
Lead Channel Setting Pacing Pulse Width: 0.8 ms
Lead Channel Setting Sensing Sensitivity: 0.3 mV
MDC IDC SET LEADCHNL LV PACING AMPLITUDE: 2.25 V
MDC IDC SET LEADCHNL RV PACING AMPLITUDE: 3 V
MDC IDC SET ZONE DETECTION INTERVAL: 350 ms
MDC IDC SET ZONE DETECTION INTERVAL: 400 ms
Zone Setting Detection Interval: 300 ms
Zone Setting Detection Interval: 350 ms

## 2013-08-18 NOTE — Progress Notes (Signed)
Echo performed. 

## 2013-08-18 NOTE — Progress Notes (Signed)
ICD interrogation-VF alerts off per Dr. Rayann Heman.

## 2013-08-19 ENCOUNTER — Telehealth: Payer: Self-pay | Admitting: *Deleted

## 2013-08-19 NOTE — Telephone Encounter (Signed)
LMTCB. Audible alerts were D/C'd. CareAlert for RV/SVC impedance needs to be D/C'd. Non urgent.

## 2013-08-27 ENCOUNTER — Encounter: Payer: Self-pay | Admitting: Internal Medicine

## 2013-11-08 ENCOUNTER — Encounter: Payer: Medicare Other | Admitting: *Deleted

## 2013-11-08 ENCOUNTER — Telehealth: Payer: Self-pay | Admitting: Cardiology

## 2013-11-08 NOTE — Telephone Encounter (Signed)
LMOVM reminding pt to send remote transmission.   

## 2013-11-10 ENCOUNTER — Encounter: Payer: Self-pay | Admitting: Cardiology

## 2013-12-15 ENCOUNTER — Encounter: Payer: Self-pay | Admitting: Cardiology

## 2014-01-13 ENCOUNTER — Encounter (HOSPITAL_COMMUNITY): Payer: Self-pay | Admitting: Internal Medicine

## 2014-03-10 ENCOUNTER — Encounter: Payer: Self-pay | Admitting: *Deleted

## 2014-03-18 ENCOUNTER — Encounter: Payer: Self-pay | Admitting: Internal Medicine

## 2014-03-18 ENCOUNTER — Ambulatory Visit (INDEPENDENT_AMBULATORY_CARE_PROVIDER_SITE_OTHER): Payer: Medicare Other | Admitting: *Deleted

## 2014-03-18 DIAGNOSIS — I5022 Chronic systolic (congestive) heart failure: Secondary | ICD-10-CM | POA: Diagnosis not present

## 2014-03-18 LAB — MDC_IDC_ENUM_SESS_TYPE_INCLINIC
Battery Voltage: 3.04 V
Brady Statistic AS VP Percent: 96.83 %
Brady Statistic RA Percent Paced: 1.94 %
Brady Statistic RV Percent Paced: 98.75 %
Date Time Interrogation Session: 20160212154205
HIGH POWER IMPEDANCE MEASURED VALUE: 254 Ohm
HighPow Impedance: 1520 Ohm
HighPow Impedance: 254 Ohm
HighPow Impedance: 4047 Ohm
Lead Channel Impedance Value: 399 Ohm
Lead Channel Impedance Value: 475 Ohm
Lead Channel Pacing Threshold Amplitude: 0.5 V
Lead Channel Pacing Threshold Amplitude: 1.25 V
Lead Channel Pacing Threshold Pulse Width: 0.4 ms
Lead Channel Pacing Threshold Pulse Width: 0.4 ms
Lead Channel Pacing Threshold Pulse Width: 0.8 ms
Lead Channel Sensing Intrinsic Amplitude: 10.375 mV
Lead Channel Sensing Intrinsic Amplitude: 10.875 mV
Lead Channel Sensing Intrinsic Amplitude: 2.375 mV
Lead Channel Setting Pacing Amplitude: 2 V
Lead Channel Setting Pacing Pulse Width: 0.6 ms
Lead Channel Setting Pacing Pulse Width: 0.8 ms
MDC IDC MSMT LEADCHNL LV IMPEDANCE VALUE: 4047 Ohm
MDC IDC MSMT LEADCHNL LV IMPEDANCE VALUE: 4047 Ohm
MDC IDC MSMT LEADCHNL LV IMPEDANCE VALUE: 931 Ohm
MDC IDC MSMT LEADCHNL RA SENSING INTR AMPL: 2.125 mV
MDC IDC MSMT LEADCHNL RV PACING THRESHOLD AMPLITUDE: 1.375 V
MDC IDC SET LEADCHNL RA PACING AMPLITUDE: 2 V
MDC IDC SET LEADCHNL RV PACING AMPLITUDE: 2.5 V
MDC IDC SET LEADCHNL RV SENSING SENSITIVITY: 0.3 mV
MDC IDC SET ZONE DETECTION INTERVAL: 300 ms
MDC IDC SET ZONE DETECTION INTERVAL: 350 ms
MDC IDC SET ZONE DETECTION INTERVAL: 350 ms
MDC IDC STAT BRADY AP VP PERCENT: 1.92 %
MDC IDC STAT BRADY AP VS PERCENT: 0.02 %
MDC IDC STAT BRADY AS VS PERCENT: 1.22 %
Zone Setting Detection Interval: 400 ms

## 2014-03-18 NOTE — Progress Notes (Signed)
Overdue CRT-D device check in office. Thresholds and sensing consistent with previous device measurements. Lead impedance trends stable since last visit (RV & SVC HV impedance both still >200ohms). No mode switch episodes recorded. No ventricular arrhythmia episodes recorded. Patient bi-ventricularly pacing 98.7% of the time. Device programmed with appropriate safety margins---changed RV output from 2.75V@0 .65ms to 2.5V@0 .20ms, LV output from 2.75 to 2.00V. Changed max LV impedance from 1000 to 1,500 ohms. Turned off carelink alerts for RV & SVC defib impedance. VF&VT Detections were off, turned off VF&VT therapies. Battery @3 .04V. ROV w/ Dr. Rayann Heman 06/20/14. [place] ordered.

## 2014-03-31 ENCOUNTER — Encounter: Payer: Medicare Other | Admitting: Internal Medicine

## 2014-06-16 ENCOUNTER — Other Ambulatory Visit: Payer: Self-pay

## 2014-06-20 ENCOUNTER — Ambulatory Visit (INDEPENDENT_AMBULATORY_CARE_PROVIDER_SITE_OTHER): Payer: Medicare Other | Admitting: Internal Medicine

## 2014-06-20 ENCOUNTER — Encounter: Payer: Self-pay | Admitting: Internal Medicine

## 2014-06-20 VITALS — BP 116/70 | HR 72 | Ht 72.0 in | Wt 232.4 lb

## 2014-06-20 DIAGNOSIS — I5022 Chronic systolic (congestive) heart failure: Secondary | ICD-10-CM

## 2014-06-20 DIAGNOSIS — I5023 Acute on chronic systolic (congestive) heart failure: Secondary | ICD-10-CM

## 2014-06-20 DIAGNOSIS — E669 Obesity, unspecified: Secondary | ICD-10-CM | POA: Diagnosis not present

## 2014-06-20 DIAGNOSIS — T82110D Breakdown (mechanical) of cardiac electrode, subsequent encounter: Secondary | ICD-10-CM | POA: Diagnosis not present

## 2014-06-20 DIAGNOSIS — T82190D Other mechanical complication of cardiac electrode, subsequent encounter: Secondary | ICD-10-CM

## 2014-06-20 LAB — CUP PACEART INCLINIC DEVICE CHECK
Battery Voltage: 3.01 V
Brady Statistic AP VP Percent: 2.08 %
Brady Statistic AS VP Percent: 96.81 %
Brady Statistic RA Percent Paced: 2.1 %
Brady Statistic RV Percent Paced: 98.89 %
Date Time Interrogation Session: 20160516172226
HIGH POWER IMPEDANCE MEASURED VALUE: 254 Ohm
HighPow Impedance: 1482 Ohm
HighPow Impedance: 254 Ohm
HighPow Impedance: 4047 Ohm
Lead Channel Impedance Value: 361 Ohm
Lead Channel Impedance Value: 4047 Ohm
Lead Channel Impedance Value: 456 Ohm
Lead Channel Pacing Threshold Amplitude: 1.25 V
Lead Channel Pacing Threshold Amplitude: 1.25 V
Lead Channel Pacing Threshold Pulse Width: 0.4 ms
Lead Channel Pacing Threshold Pulse Width: 0.4 ms
Lead Channel Pacing Threshold Pulse Width: 0.8 ms
Lead Channel Sensing Intrinsic Amplitude: 2.5 mV
Lead Channel Sensing Intrinsic Amplitude: 6.625 mV
Lead Channel Setting Pacing Amplitude: 2.25 V
Lead Channel Setting Pacing Amplitude: 2.5 V
MDC IDC MSMT LEADCHNL LV IMPEDANCE VALUE: 4047 Ohm
MDC IDC MSMT LEADCHNL LV IMPEDANCE VALUE: 950 Ohm
MDC IDC MSMT LEADCHNL RA PACING THRESHOLD AMPLITUDE: 0.5 V
MDC IDC SET LEADCHNL LV PACING PULSEWIDTH: 0.8 ms
MDC IDC SET LEADCHNL RA PACING AMPLITUDE: 2 V
MDC IDC SET LEADCHNL RV PACING PULSEWIDTH: 0.4 ms
MDC IDC SET LEADCHNL RV SENSING SENSITIVITY: 0.3 mV
MDC IDC SET ZONE DETECTION INTERVAL: 350 ms
MDC IDC SET ZONE DETECTION INTERVAL: 400 ms
MDC IDC STAT BRADY AP VS PERCENT: 0.02 %
MDC IDC STAT BRADY AS VS PERCENT: 1.09 %
Zone Setting Detection Interval: 300 ms
Zone Setting Detection Interval: 350 ms

## 2014-06-20 MED ORDER — FUROSEMIDE 40 MG PO TABS: 40.0000 mg | ORAL_TABLET | Freq: Every day | ORAL | Status: DC

## 2014-06-20 NOTE — Progress Notes (Signed)
Primary Cardiologist: [name], MD  [name] is a 81 y.o. male who presents today for routine electrophysiology followup.  Since that time, the patient reports doing reasonably well. He remains active for his age.  He has developed BLE edema with leg heaviness. Today, he denies symptoms of palpitations, chest pain, shortness of breath,  dizziness, presyncope, syncope, or ICD shocks.  The patient is otherwise without complaint today.   Past Medical History  Diagnosis Date  . Chronic systolic congestive heart failure   . Nonischemic cardiomyopathy     EF improved from 20% to 50% with CRT  . Myasthenia gravis     mild  . Pickwickian syndrome   . Obesity   . DJD (degenerative joint disease)   . Depression   . Schwannoma     rested at [facility]  . Implantable cardioverter-defibrillator lead failure     s/p lead revision in 2010 by Dr [name],  subsequent newly placed RV defibrillator lead has fractured.  ICD therapies programmed off 6/15   Past Surgical History  Procedure Laterality Date  . Cardiac defibrillator placement  09/13/05, 12/07/2007, 05/23/11    MDT BiV ICD implanted by Dr [name], His Fidelis [phone] lead fractured and was replaced by Dr [name] 12/07/2007, Generator change by Dr [name] 05/23/11  . Schwannoma resection      removed at [facility]  . Biv icd genertaor change out Left 05/23/2011    Procedure: BIV ICD GENERTAOR CHANGE OUT;  Surgeon: [name], MD;  Location: [facility] CATH LAB;  Service: Cardiovascular;  Laterality: Left;    Current Outpatient Prescriptions  Medication Sig Dispense Refill  . aspirin 325 MG tablet Take 325 mg by mouth daily.     . carvedilol (COREG) 12.5 MG tablet take 1 tablet by mouth twice a day 60 tablet 0  . Cholecalciferol (VITAMIN D3) 2000 UNITS TABS Take 1 tablet by mouth daily.    . cycloSPORINE modified (GENGRAF) 25 MG capsule Take 25 mg by mouth 2 (two) times daily.    . furosemide (LASIX) 40 MG tablet Take 1 tablet (40 mg total) by mouth daily.  90 tablet 3  . losartan (COZAAR) 25 MG tablet take 1 tablet by mouth once daily 90 tablet 3   No current facility-administered medications for this visit.   ROS- all systems reviewed and negative except as per HPI above  Physical Exam: Filed Vitals:   06/20/14 1636  BP: 116/70  Pulse: 72  Height: 6' (1.829 m)  Weight: 232 lb 6.4 oz (105.416 kg)    GEN- The patient is well appearing, alert and oriented x 3 today.   Head- normocephalic, atraumatic Eyes-  Sclera clear, conjunctiva pink Ears- hearing intact Oropharynx- clear Lungs- Clear to ausculation bilaterally, normal work of breathing Chest- ICD pocket is well healed Heart- Regular rate and rhythm, no murmurs, rubs or gallops, PMI not laterally displaced GI- soft, NT, ND, + BS Extremities- no clubbing, cyanosis, +1 edema  ICD interrogation- reviewed in detail today,  See PACEART report ekg today reveals sinus with BiV pacing and occasional PVCs  Assessment and Plan:  1. Nonischemic CM/ acute on chronic systolic dysfunction Mildly volume overloaded Restart lasix bmet He has responded previously to BiV pacing. Echo 2015 is reviewed  2. ICD lead fracture The patient has new out of range impedence on his ICD HV SVC and RV shock coils previously, suggestive of lead fracture.  The pace/ sense portion of the lead appears to be working fine at this time.  He previously had a lead fracture (MDT [place]) in 2009 for which he had a new MDT [employer] Quattro [phone] lead placed. He is noted to have 4 leads in his heart. We have therefore elected to leave tachy therapies off and are using his device as a biv pacemaker.  His LV lead is programmed to a bipolar tip/ring configuation.  The importance of compliance with remote monitoring was stressed today.  3. Obesity Weight loss is advised  Return to see me in 12 months Follow-up with Dr [name] as scheduled carelink monitoring is also encouraged today

## 2014-06-20 NOTE — Patient Instructions (Addendum)
Medication Instructions:  Your physician has recommended you make the following change in your medication:  1) Start Furosemide 40 mg daily     Labwork: Your physician recommends that you return for lab work today: BMP   Testing/Procedures: None ordered  Follow-Up: Your physician wants you to follow-up in: 12 months with [name] Dennis Bast will receive a reminder letter in the mail two months in advance. If you don't receive a letter, please call our office to schedule the follow-up appointment.  Remote monitoring is used to monitor your Pacemaker of ICD from home. This monitoring reduces the number of office visits required to check your device to one time per year. It allows [place] to keep an eye on the functioning of your device to ensure it is working properly. You are scheduled for a device check from home on 09/19/14. You may send your transmission at any time that day. If you have a wireless device, the transmission will be sent automatically. After your physician reviews your transmission, you will receive a postcard with your next transmission date.      Any Other Special Instructions Will Be Listed Below (If Applicable).

## 2014-06-21 LAB — BASIC METABOLIC PANEL
BUN: 31 mg/dL — AB (ref 6–23)
CO2: 28 mEq/L (ref 19–32)
CREATININE: 1.13 mg/dL (ref 0.40–1.50)
Calcium: 8.4 mg/dL (ref 8.4–10.5)
Chloride: 102 mEq/L (ref 96–112)
GFR: 66.61 mL/min (ref >60.00)
GLUCOSE: 91 mg/dL (ref 70–99)
Potassium: 4.1 mEq/L (ref 3.5–5.1)
Sodium: 136 mEq/L (ref 135–145)

## 2014-06-24 ENCOUNTER — Encounter: Payer: Self-pay | Admitting: Internal Medicine

## 2014-07-08 ENCOUNTER — Encounter: Payer: Self-pay | Admitting: Cardiology

## 2014-07-08 ENCOUNTER — Ambulatory Visit: Payer: Medicare Other | Admitting: Cardiology

## 2014-07-08 ENCOUNTER — Ambulatory Visit (INDEPENDENT_AMBULATORY_CARE_PROVIDER_SITE_OTHER): Payer: Medicare Other | Admitting: Cardiology

## 2014-07-08 VITALS — BP 90/60 | HR 76 | Ht 72.0 in | Wt 231.0 lb

## 2014-07-08 DIAGNOSIS — I5022 Chronic systolic (congestive) heart failure: Secondary | ICD-10-CM

## 2014-07-08 DIAGNOSIS — Z95810 Presence of automatic (implantable) cardiac defibrillator: Secondary | ICD-10-CM | POA: Diagnosis not present

## 2014-07-08 DIAGNOSIS — R05 Cough: Secondary | ICD-10-CM | POA: Diagnosis not present

## 2014-07-08 DIAGNOSIS — R058 Other specified cough: Secondary | ICD-10-CM

## 2014-07-08 MED ORDER — CARVEDILOL 6.25 MG PO TABS: 6.2500 mg | ORAL_TABLET | Freq: Two times a day (BID) | ORAL | Status: DC

## 2014-07-08 MED ORDER — FUROSEMIDE 40 MG PO TABS: 40.0000 mg | ORAL_TABLET | ORAL | Status: DC

## 2014-07-08 NOTE — Patient Instructions (Signed)
Medication Instructions:  Please decrease your Carvedilol to 6.25 mg twice a day, decrease Furosemide to every other day. Continue all other medications as listed.  Follow-Up: Follow up in 1 month with Richardson Dopp, PA.  Thank you for choosing [facility]!!

## 2014-07-08 NOTE — Progress Notes (Signed)
[facility]. [address], [place]  [address] Phone: [phone] Fax:  [phone]  Date:  07/08/2014   ID:  [name], DOB 07/17/1933, MRN [id number]  PCP:  [name], MD   History of Present Illness: [name] is a 81 y.o. male with prior nonischemic cardiomyopathy, biventricular ICD placed in 2007, upgraded in April of 2013 by Dr. Rayann Heman with ejection fraction increased from 20% now 50% doing very well. Denies any edema, orthopnea, syncope, PND. No strokelike symptoms.  Overactive bladder.  He gets up every 45 minutes at times to go to the bathroom at night. He also states that he has some dizziness at times when walking. Feels as though the room is in motion. Gait instability, vertiginous-like symptoms.  After changing into ARB, his dry cough is mildly improved however he still has the sensation of needing to clear his throat. This may be postnasal drip.   his blood pressure is fairly low. See below. Medications changed. He has not had syncope. He does feel sluggish at times. Some trouble with walking.   Wt Readings from Last 3 Encounters:  07/08/14 231 lb (104.781 kg)  06/20/14 232 lb 6.4 oz (105.416 kg)  08/04/13 218 lb (98.884 kg)     Past Medical History  Diagnosis Date  . Chronic systolic congestive heart failure   . Nonischemic cardiomyopathy     EF improved from 20% to 50% with CRT  . Myasthenia gravis     mild  . Pickwickian syndrome   . Obesity   . DJD (degenerative joint disease)   . Depression   . Schwannoma     rested at [facility]  . Implantable cardioverter-defibrillator lead failure     s/p lead revision in 2010 by Dr [name],  subsequent newly placed RV defibrillator lead has fractured.  ICD therapies programmed off 6/15    Past Surgical History  Procedure Laterality Date  . Cardiac defibrillator placement  09/13/05, 12/07/2007, 05/23/11    MDT BiV ICD implanted by Dr [name], His Fidelis [phone] lead fractured and was  replaced by Dr [name] 12/07/2007, Generator change by Dr [name] 05/23/11  . Schwannoma resection      removed at [facility]  . Biv icd genertaor change out Left 05/23/2011    Procedure: BIV ICD GENERTAOR CHANGE OUT;  Surgeon: [name], MD;  [facility] C[facility] LAB;  Service: Cardiovascular;  Laterality: Left;    Current Outpatient Prescriptions  Medication Sig Dispense Refill  . aspirin 325 MG tablet Take 325 mg by mouth daily.     . carvedilol (COREG) 12.5 MG tablet take 1 tablet by mouth twice a day 60 tablet 0  . Cholecalciferol (VITAMIN D3) 2000 UNITS TABS Take 1 tablet by mouth daily.    . cycloSPORINE modified (GENGRAF) 25 MG capsule Take 25 mg by mouth 2 (two) times daily.    . furosemide (LASIX) 40 MG tablet Take 1 tablet (40 mg total) by mouth daily. 90 tablet 3  . losartan (COZAAR) 25 MG tablet take 1 tablet by mouth once daily 90 tablet 3   No current facility-administered medications for this visit.    Allergies:    Allergies  Allergen Reactions  . Penicillins Hives and Rash    Social History:  The patient  reports that he has never smoked. He does not have any smokeless tobacco history on file. He reports that he does not drink alcohol or use illicit drugs.   ROS:  Please see the history of present illness.   Urinary frequency. Difficultly with balacne.  PHYSICAL EXAM: VS:  BP 90/60 mmHg  Pulse 76  Ht 6' (1.829 m)  Wt 231 lb (104.781 kg)  BMI 31.32 kg/m2 Well nourished, well developed, in no acute distress HEENT: normal Neck: no JVD Cardiac:  normal S1, S2; RRR; no murmurrare ectopy Lungs:  clear to auscultation bilaterally, no wheezing, rhonchi or rales Abd: soft, nontender, no hepatomegalyobese Ext: no edema Skin: warm and dry Neuro: no focal abnormalities noted  EKG:  Today: 07/08/14 - NSR with paced V.  08/02/13-heart rate 88 beats per minute Ventricular pacing, 68, occasional capture     ASSESSMENT AND PLAN:  1. Cardiomyopathy-improved EF to 50% on 08/18/13.  Excellent. Medications reviewed. He comes of recent hypotension, I have asked him to take his Lasix every other day 40 mg. I've also decreased his carvedilol to 6.25 mg twice a day from 12.5. We will continue with low-dose losartan. 2. Dry cough-switched him from lisinopril to losartan 25 mg once a day. Mild improvement but he still feeling this. I asked to discuss this with his primary physician. This is likely from postnasal drip..  3. ICD-biventricular, doing well. Dr. Rayann Heman. He had lead fracture. Replaced with new lead. 4 leads in total. 4. Chronic systolic left ventricular dysfunction/heart failure-appears fairly well compensated.  Lasix every other day , hypotension Urinary incontinence has been an issue. He may wish to discuss this with a urologist/PCP. 5. Decreased weight. Good Job with weight loss. He states that he does have decreased appetite. Continue to monitor this.  6. One  Month follow-up, Richardson Dopp.  Signed, [name], MD [facility]  07/08/2014 4:01 PM

## 2014-07-10 ENCOUNTER — Other Ambulatory Visit: Payer: Self-pay | Admitting: Cardiology

## 2014-08-28 NOTE — Progress Notes (Signed)
Cardiology Office Note   Date:  08/29/2014   ID:  [name], DOB November 01, 1933, MRN [id number]  PCP:  Shaun Amel, MD  Cardiologist:  Dr. [name]   Electrophysiologist:  Dr. [name]   Chief Complaint  Patient presents with  . Congestive Heart Failure     History of Present Illness: [name] is a 81 y.o. Mexican-American male with a hx of systolic HF, NICM, s/p CRT-D.  Previous EF was 20% and had improved to 50%.  Most recent echo 7/15 demonstrated EF 35-40%. Last seen by Dr. [name] 07/08/14. Blood pressure was running low. Carvedilol was decreased to 6.25 mg twice a day. Furosemide was changed to every other day.  He returns for follow-up.  He is here alone. 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  He is doing well.  The patient denies chest pain, significant shortness of breath, syncope, orthopnea, PND or significant pedal edema.  He is limited by knee DJD.  He is overall NYHA 2b. He is concerned about his balance and has some near falls.    Studies/Reports Reviewed Today:  Echo 08/18/13 - Left ventricle:  mild LVH. EF 35% to 40%. Diffuse HK. Grade 2 diastolic dysfunction. - Ventricular septum: There is marked septal-lateral andapical-basal dyssynchrony due to abnormal electrical activation. - Aortic valve: There was mild regurgitation. - Mitral valve: Calcified annulus. - Left atrium: The atrium was moderately dilated.  LHC 01/2004 LM:  There is no disease noted in the left main coronary artery. LAD:   There is luminalirregularities noted in the left anterior descending only. RI:   There is no disease noted inthe ramus vessel. LCx:    There is nodisease noted in the circumflex or its branches. RCA:   There is no disease noted in the rightcoronary artery.   Past Medical History  Diagnosis Date  . Chronic systolic congestive heart failure   . Nonischemic cardiomyopathy     EF improved from 20% to 50% with CRT  .  Myasthenia gravis     mild  . Pickwickian syndrome   . Obesity   . DJD (degenerative joint disease)   . Depression   . Schwannoma     rested at [facility]  . Implantable cardioverter-defibrillator lead failure     s/p lead revision in 2010 by Dr [name],  subsequent newly placed RV defibrillator lead has fractured.  ICD therapies programmed off 6/15    Past Surgical History  Procedure Laterality Date  . Cardiac defibrillator placement  09/13/05, 12/07/2007, 05/23/11    MDT BiV ICD implanted by Dr [name], His Fidelis [phone] lead fractured and was replaced by Dr [name] 12/07/2007, Generator change by Dr [name] 05/23/11  . Schwannoma resection      removed at [facility]  . Biv icd genertaor change out Left 05/23/2011    Procedure: BIV ICD GENERTAOR CHANGE OUT;  Surgeon: [name], MD;  Location: [facility] CATH LAB;  Service: Cardiovascular;  Laterality: Left;     Current Outpatient Prescriptions  Medication Sig Dispense Refill  . aspirin 325 MG tablet Take 325 mg by mouth daily.     . carvedilol (COREG) 6.25 MG tablet Take 1 tablet (6.25 mg total) by mouth 2 (two) times daily. 60 tablet 3  . Cholecalciferol (VITAMIN D3) 2000 UNITS TABS Take 1 tablet by mouth daily.    . cycloSPORINE modified (GENGRAF) 25 MG capsule Take 25 mg by mouth 2 (two) times daily.    . furosemide (  LASIX) 40 MG tablet Take 1 tablet (40 mg total) by mouth every other day. 90 tablet 3  . losartan (COZAAR) 25 MG tablet take 1 tablet by mouth once daily 90 tablet 1  . Multiple Vitamins-Minerals (MENS MULTI VITAMIN & MINERAL PO) Take 1 tablet by mouth daily.     No current facility-administered medications for this visit.    Allergies:   Penicillins    Social History:  The patient  reports that he has never smoked. He does not have any smokeless tobacco history on file. He reports that he does not drink alcohol or use illicit drugs.   Family History:  The patient's family history includes Lung disease in his father. There is no  history of Heart attack or Stroke.    ROS:   Please see the history of present illness.   Review of Systems  Musculoskeletal: Positive for back pain and joint pain.  Psychiatric/Behavioral: The patient is nervous/anxious.   All other systems reviewed and are negative.     PHYSICAL EXAM: VS:  BP 110/62 mmHg  Pulse 62  Ht 6' (1.829 m)  Wt 235 lb 12.8 oz (106.958 kg)  BMI 31.97 kg/m2    Wt Readings from Last 3 Encounters:  08/29/14 235 lb 12.8 oz (106.958 kg)  07/08/14 231 lb (104.781 kg)  06/20/14 232 lb 6.4 oz (105.416 kg)     GEN: Well nourished, well developed, in no acute distress HEENT: normal Neck: no JVD,  no masses Cardiac:  Normal S1/S2, RRR; no murmur,  no rubs or gallops, trace bilateral  edema   Respiratory:  clear to auscultation bilaterally, no wheezing, rhonchi or rales. GI: soft, nontender, nondistended, + BS MS: no deformity or atrophy Skin: warm and dry  Neuro:  CNs II-XII intact, Strength and sensation are intact Psych: Normal affect   EKG:  EKG is ordered today.  It demonstrates:   A sensed, V paced, HR 62   Recent Labs: 06/20/2014: BUN 31*; Creatinine, Ser 1.13; Potassium 4.1; Sodium 136    Lipid Panel No results found for: CHOL, TRIG, HDL, CHOLHDL, VLDL, LDLCALC, LDLDIRECT    ASSESSMENT AND PLAN:  NICM (nonischemic cardiomyopathy):  Recent Echo with EF 35-40%. BP looks better on lower dose of Lasix and Coreg.  Continue current dose of beta-blocker, angiotensin receptor blocker.  Chronic systolic CHF (congestive heart failure):  Volume stable.  He is mainly NYHA 2b secondary to limitations from his knee.  Biventricular ICD (implantable cardioverter-defibrillator) in place:  FU with EP as planned.     Medication Changes: Current medicines are reviewed at length with the patient today.  Concerns regarding medicines are as outlined above.  The following changes have been made:   Discontinued Medications   No medications on file   Modified  Medications   No medications on file   New Prescriptions   No medications on file    Labs/ tests ordered today include:   No orders of the defined types were placed in this encounter.     Disposition:   FU with Dr. [name] or me 6 mos.    Signed, Versie Starks, MHS 08/29/2014 2:01 PM    [facility] [address], [place], [place]  [address] Phone: [phone]; Fax: [phone]

## 2014-08-29 ENCOUNTER — Ambulatory Visit (INDEPENDENT_AMBULATORY_CARE_PROVIDER_SITE_OTHER): Payer: Medicare Other | Admitting: Physician Assistant

## 2014-08-29 ENCOUNTER — Encounter: Payer: Self-pay | Admitting: Physician Assistant

## 2014-08-29 VITALS — BP 110/62 | HR 62 | Ht 72.0 in | Wt 235.8 lb

## 2014-08-29 DIAGNOSIS — I428 Other cardiomyopathies: Secondary | ICD-10-CM

## 2014-08-29 DIAGNOSIS — I429 Cardiomyopathy, unspecified: Secondary | ICD-10-CM

## 2014-08-29 DIAGNOSIS — I5022 Chronic systolic (congestive) heart failure: Secondary | ICD-10-CM

## 2014-08-29 DIAGNOSIS — Z95810 Presence of automatic (implantable) cardiac defibrillator: Secondary | ICD-10-CM

## 2014-08-29 NOTE — Patient Instructions (Signed)
Medication Instructions:  No changes.  Continue your same medications.  Labwork: None today.   Testing/Procedures: None   Follow-Up: Dr. [name] or Richardson Dopp, PA-C in 6 months.  Any Other Special Instructions Will Be Listed Below (If Applicable).   If you are interested in getting your balance checked, you can call: [place] [phone] Ask for a free balance screen.

## 2014-09-19 ENCOUNTER — Ambulatory Visit (INDEPENDENT_AMBULATORY_CARE_PROVIDER_SITE_OTHER): Payer: Medicare Other | Admitting: *Deleted

## 2014-09-19 ENCOUNTER — Encounter: Payer: Self-pay | Admitting: Internal Medicine

## 2014-09-19 DIAGNOSIS — I5022 Chronic systolic (congestive) heart failure: Secondary | ICD-10-CM

## 2014-09-19 DIAGNOSIS — I428 Other cardiomyopathies: Secondary | ICD-10-CM

## 2014-09-19 DIAGNOSIS — I429 Cardiomyopathy, unspecified: Secondary | ICD-10-CM

## 2014-09-19 NOTE — Progress Notes (Signed)
Remote ICD transmission.   

## 2014-09-22 LAB — CUP PACEART REMOTE DEVICE CHECK
Battery Voltage: 3 V
Brady Statistic AP VS Percent: 0.02 %
Brady Statistic AS VS Percent: 1 %
Brady Statistic RV Percent Paced: 98.98 %
HIGH POWER IMPEDANCE MEASURED VALUE: 4047 Ohm
HighPow Impedance: 254 Ohm
HighPow Impedance: 254 Ohm
Lead Channel Impedance Value: 4047 Ohm
Lead Channel Impedance Value: 4047 Ohm
Lead Channel Pacing Threshold Amplitude: 0.625 V
Lead Channel Pacing Threshold Amplitude: 1.25 V
Lead Channel Pacing Threshold Amplitude: 1.25 V
Lead Channel Pacing Threshold Pulse Width: 0.4 ms
Lead Channel Pacing Threshold Pulse Width: 0.4 ms
Lead Channel Pacing Threshold Pulse Width: 0.8 ms
Lead Channel Sensing Intrinsic Amplitude: 7 mV
Lead Channel Setting Pacing Amplitude: 2.75 V
Lead Channel Setting Pacing Amplitude: 2.75 V
Lead Channel Setting Pacing Pulse Width: 0.8 ms
Lead Channel Setting Sensing Sensitivity: 0.3 mV
MDC IDC MSMT LEADCHNL LV IMPEDANCE VALUE: 1007 Ohm
MDC IDC MSMT LEADCHNL RA IMPEDANCE VALUE: 475 Ohm
MDC IDC MSMT LEADCHNL RA SENSING INTR AMPL: 2.25 mV
MDC IDC MSMT LEADCHNL RV IMPEDANCE VALUE: 399 Ohm
MDC IDC SESS DTM: 20160815063329
MDC IDC SET LEADCHNL RA PACING AMPLITUDE: 2 V
MDC IDC SET LEADCHNL RV PACING PULSEWIDTH: 0.4 ms
MDC IDC STAT BRADY AP VP PERCENT: 2.13 %
MDC IDC STAT BRADY AS VP PERCENT: 96.85 %
MDC IDC STAT BRADY RA PERCENT PACED: 2.15 %
Zone Setting Detection Interval: 300 ms
Zone Setting Detection Interval: 350 ms
Zone Setting Detection Interval: 350 ms
Zone Setting Detection Interval: 400 ms

## 2014-09-27 ENCOUNTER — Encounter: Payer: Self-pay | Admitting: Cardiology

## 2014-11-12 ENCOUNTER — Other Ambulatory Visit: Payer: Self-pay | Admitting: Cardiology

## 2014-12-19 ENCOUNTER — Ambulatory Visit (INDEPENDENT_AMBULATORY_CARE_PROVIDER_SITE_OTHER): Payer: Medicare Other | Admitting: *Deleted

## 2014-12-19 DIAGNOSIS — I428 Other cardiomyopathies: Secondary | ICD-10-CM

## 2014-12-19 DIAGNOSIS — I429 Cardiomyopathy, unspecified: Secondary | ICD-10-CM

## 2014-12-19 NOTE — Progress Notes (Signed)
Remote ICD transmission.   

## 2014-12-28 LAB — CUP PACEART REMOTE DEVICE CHECK
Battery Voltage: 2.97 V
Brady Statistic AS VS Percent: 0.88 %
Brady Statistic RA Percent Paced: 2.03 %
Date Time Interrogation Session: 20161114062509
HIGH POWER IMPEDANCE MEASURED VALUE: 254 Ohm
HighPow Impedance: 254 Ohm
HighPow Impedance: 4047 Ohm
Implantable Lead Implant Date: 20070810
Implantable Lead Implant Date: 20070810
Implantable Lead Implant Date: 20091102
Implantable Lead Location: 753859
Implantable Lead Location: 753860
Implantable Lead Model: 5076
Implantable Lead Model: 6947
Lead Channel Impedance Value: 4047 Ohm
Lead Channel Impedance Value: 475 Ohm
Lead Channel Pacing Threshold Amplitude: 0.625 V
Lead Channel Pacing Threshold Amplitude: 1.625 V
Lead Channel Pacing Threshold Pulse Width: 0.4 ms
Lead Channel Pacing Threshold Pulse Width: 0.8 ms
Lead Channel Sensing Intrinsic Amplitude: 2.375 mV
Lead Channel Sensing Intrinsic Amplitude: 6.625 mV
Lead Channel Sensing Intrinsic Amplitude: 6.625 mV
Lead Channel Setting Pacing Amplitude: 2 V
Lead Channel Setting Pacing Amplitude: 2.5 V
Lead Channel Setting Pacing Amplitude: 2.75 V
Lead Channel Setting Sensing Sensitivity: 0.3 mV
MDC IDC LEAD LOCATION: 753858
MDC IDC LEAD MODEL: 4194
MDC IDC MSMT LEADCHNL LV IMPEDANCE VALUE: 4047 Ohm
MDC IDC MSMT LEADCHNL LV IMPEDANCE VALUE: 931 Ohm
MDC IDC MSMT LEADCHNL RA PACING THRESHOLD PULSEWIDTH: 0.4 ms
MDC IDC MSMT LEADCHNL RA SENSING INTR AMPL: 2.375 mV
MDC IDC MSMT LEADCHNL RV IMPEDANCE VALUE: 361 Ohm
MDC IDC MSMT LEADCHNL RV PACING THRESHOLD AMPLITUDE: 1.25 V
MDC IDC SET LEADCHNL LV PACING PULSEWIDTH: 0.8 ms
MDC IDC SET LEADCHNL RV PACING PULSEWIDTH: 0.4 ms
MDC IDC STAT BRADY AP VP PERCENT: 2 %
MDC IDC STAT BRADY AP VS PERCENT: 0.02 %
MDC IDC STAT BRADY AS VP PERCENT: 97.09 %
MDC IDC STAT BRADY RV PERCENT PACED: 99.1 %

## 2015-01-03 ENCOUNTER — Other Ambulatory Visit: Payer: Self-pay | Admitting: Cardiology

## 2015-01-03 ENCOUNTER — Encounter: Payer: Self-pay | Admitting: Cardiology

## 2015-03-13 ENCOUNTER — Other Ambulatory Visit: Payer: Self-pay

## 2015-03-13 MED ORDER — CARVEDILOL 6.25 MG PO TABS: 6.2500 mg | ORAL_TABLET | Freq: Two times a day (BID) | ORAL | Status: DC

## 2015-03-20 ENCOUNTER — Ambulatory Visit (INDEPENDENT_AMBULATORY_CARE_PROVIDER_SITE_OTHER): Payer: Medicare Other | Admitting: *Deleted

## 2015-03-20 DIAGNOSIS — I428 Other cardiomyopathies: Secondary | ICD-10-CM

## 2015-03-20 DIAGNOSIS — I429 Cardiomyopathy, unspecified: Secondary | ICD-10-CM

## 2015-03-20 NOTE — Progress Notes (Signed)
Remote ICD transmission.   

## 2015-03-22 ENCOUNTER — Encounter: Payer: Self-pay | Admitting: Cardiology

## 2015-03-22 LAB — CUP PACEART REMOTE DEVICE CHECK
Battery Voltage: 2.96 V
Brady Statistic AP VP Percent: 1.65 %
Brady Statistic AP VS Percent: 0.02 %
Brady Statistic AS VS Percent: 1.02 %
Brady Statistic RV Percent Paced: 98.95 %
Date Time Interrogation Session: 20170213081611
HIGH POWER IMPEDANCE MEASURED VALUE: 4047 Ohm
HighPow Impedance: 254 Ohm
HighPow Impedance: 254 Ohm
Implantable Lead Implant Date: 20070810
Implantable Lead Implant Date: 20091102
Implantable Lead Location: 753859
Implantable Lead Location: 753860
Implantable Lead Model: 4194
Implantable Lead Model: 6947
Lead Channel Impedance Value: 4047 Ohm
Lead Channel Impedance Value: 893 Ohm
Lead Channel Pacing Threshold Amplitude: 0.625 V
Lead Channel Pacing Threshold Amplitude: 1.125 V
Lead Channel Pacing Threshold Amplitude: 1.375 V
Lead Channel Pacing Threshold Pulse Width: 0.4 ms
Lead Channel Sensing Intrinsic Amplitude: 1.875 mV
Lead Channel Sensing Intrinsic Amplitude: 1.875 mV
Lead Channel Setting Pacing Amplitude: 2 V
Lead Channel Setting Pacing Amplitude: 2.5 V
Lead Channel Setting Pacing Pulse Width: 0.4 ms
Lead Channel Setting Sensing Sensitivity: 0.3 mV
MDC IDC LEAD IMPLANT DT: 20070810
MDC IDC LEAD LOCATION: 753858
MDC IDC MSMT LEADCHNL LV IMPEDANCE VALUE: 4047 Ohm
MDC IDC MSMT LEADCHNL LV PACING THRESHOLD PULSEWIDTH: 0.8 ms
MDC IDC MSMT LEADCHNL RA IMPEDANCE VALUE: 456 Ohm
MDC IDC MSMT LEADCHNL RV IMPEDANCE VALUE: 361 Ohm
MDC IDC MSMT LEADCHNL RV PACING THRESHOLD PULSEWIDTH: 0.4 ms
MDC IDC MSMT LEADCHNL RV SENSING INTR AMPL: 6.875 mV
MDC IDC MSMT LEADCHNL RV SENSING INTR AMPL: 6.875 mV
MDC IDC SET LEADCHNL LV PACING PULSEWIDTH: 0.8 ms
MDC IDC SET LEADCHNL RV PACING AMPLITUDE: 2.5 V
MDC IDC STAT BRADY AS VP PERCENT: 97.31 %
MDC IDC STAT BRADY RA PERCENT PACED: 1.67 %

## 2015-03-23 ENCOUNTER — Ambulatory Visit (INDEPENDENT_AMBULATORY_CARE_PROVIDER_SITE_OTHER): Payer: Medicare Other | Admitting: Cardiology

## 2015-03-23 ENCOUNTER — Encounter: Payer: Self-pay | Admitting: Cardiology

## 2015-03-23 VITALS — BP 118/72 | HR 78 | Ht 72.0 in | Wt 226.4 lb

## 2015-03-23 DIAGNOSIS — T82110S Breakdown (mechanical) of cardiac electrode, sequela: Secondary | ICD-10-CM

## 2015-03-23 DIAGNOSIS — I5022 Chronic systolic (congestive) heart failure: Secondary | ICD-10-CM

## 2015-03-23 DIAGNOSIS — T82190S Other mechanical complication of cardiac electrode, sequela: Secondary | ICD-10-CM

## 2015-03-23 DIAGNOSIS — E669 Obesity, unspecified: Secondary | ICD-10-CM

## 2015-03-23 MED ORDER — CARVEDILOL 6.25 MG PO TABS: 6.2500 mg | ORAL_TABLET | Freq: Two times a day (BID) | ORAL | Status: DC

## 2015-03-23 MED ORDER — LOSARTAN POTASSIUM 25 MG PO TABS: 25.0000 mg | ORAL_TABLET | Freq: Every day | ORAL | Status: DC

## 2015-03-23 MED ORDER — ASPIRIN 81 MG PO TABS: 81.0000 mg | ORAL_TABLET | Freq: Every day | ORAL | Status: DC

## 2015-03-23 NOTE — Patient Instructions (Signed)
Medication Instructions:  Please decrease your ASA to 81 mg a day. Continue all other medications as listed.  Follow-Up: Follow up in 6 months with Dr. [name].  You will receive a letter in the mail 2 months before you are due.  Please call us when you receive this letter to schedule your follow up appointment.  If you need a refill on your cardiac medications before your next appointment, please call your pharmacy.  Thank you for choosing [facility]!!

## 2015-03-23 NOTE — Progress Notes (Signed)
[name]. [address], [address], [place]  [address] Phone: [phone] Fax:  [phone]  Date:  03/23/2015   ID:  Farrel Gobble Owusu, DOB May 12, 1933, MRN [id number]  PCP:  [name], MD   History of Present Illness: [place] A Kovarik is a 82 y.o. male with prior nonischemic cardiomyopathy, biventricular ICD placed in 2007, upgraded in April of 2013 by Dr. Rayann Heman with ejection fraction increased from 20% now 50% doing very well. Denies any edema, orthopnea, syncope, PND. No strokelike symptoms.  Overactive bladder.  He gets up every 45 minutes at times to go to the bathroom at night. He also states that he has some dizziness at times when walking. Feels as though the room is in motion. Gait instability, vertiginous-like symptoms. Overactive bladder, insomnia.   After changing into ARB, his dry cough is mildly improved however he still has the sensation of needing to clear his throat. This may be postnasal drip.  Blood pressure was low in the past, improved after decreasing dosage of medication. See below. Medications changed. He has not had syncope. He does feel sluggish at times. Some trouble with walking. Main complaints are knee osteoarthritis, urinary frequency.   Wt Readings from Last 3 Encounters:  03/23/15 226 lb 6.4 oz (102.694 kg)  08/29/14 235 lb 12.8 oz (106.958 kg)  07/08/14 231 lb (104.781 kg)     Past Medical History  Diagnosis Date  . Chronic systolic congestive heart failure ([place])   . Nonischemic cardiomyopathy (HCC)     EF improved from 20% to 50% with CRT  . Myasthenia gravis     mild  . Pickwickian syndrome ([place])   . Obesity   . DJD (degenerative joint disease)   . Depression   . Schwannoma     rested at [facility]  . Implantable cardioverter-defibrillator lead failure     s/p lead revision in 2010 by Dr [name],  subsequent newly placed RV defibrillator lead has fractured.  ICD therapies programmed off 6/15    Past Surgical History  Procedure  Laterality Date  . Cardiac defibrillator placement  09/13/05, 12/07/2007, 05/23/11    MDT BiV ICD implanted by Dr [name], His Fidelis [phone] lead fractured and was replaced by Dr [name] 12/07/2007, Generator change by Dr [name] 05/23/11  . Schwannoma resection      removed at [facility]  . Biv icd genertaor change out Left 05/23/2011    Procedure: BIV ICD GENERTAOR CHANGE OUT;  Surgeon: [name], MD;  Location: [facility] CATH LAB;  Service: Cardiovascular;  Laterality: Left;    Current Outpatient Prescriptions  Medication Sig Dispense Refill  . aspirin 325 MG tablet Take 325 mg by mouth daily.     . carvedilol (COREG) 6.25 MG tablet Take 1 tablet (6.25 mg total) by mouth 2 (two) times daily. 60 tablet 0  . Cholecalciferol (VITAMIN D3) 2000 UNITS TABS Take 1 tablet by mouth daily.    . cyanocobalamin 100 MCG tablet Take 100 mcg by mouth daily.    . cycloSPORINE modified (GENGRAF) 25 MG capsule Take 25 mg by mouth 2 (two) times daily.    Marland Kitchen docusate sodium (COLACE) 100 MG capsule Take 100 mg by mouth daily as needed for mild constipation.    . furosemide (LASIX) 40 MG tablet Take 1 tablet (40 mg total) by mouth every other day. 90 tablet 3  . losartan (COZAAR) 25 MG tablet take 1 tablet by mouth once daily 90 tablet 2  . Multiple Vitamins-Minerals (  MENS MULTI VITAMIN & MINERAL PO) Take 1 tablet by mouth daily.     No current facility-administered medications for this visit.    Allergies:    Allergies  Allergen Reactions  . Penicillins Hives and Rash    Social History:  The patient  reports that he has never smoked. He does not have any smokeless tobacco history on file. He reports that he does not drink alcohol or use illicit drugs.   ROS:  Please see the history of present illness.   Urinary frequency. Difficultly with balacne.  PHYSICAL EXAM: VS:  BP 118/72 mmHg  Pulse 78  Ht 6' (1.829 m)  Wt 226 lb 6.4 oz (102.694 kg)  BMI 30.70 kg/m2 Well nourished, well developed, in no acute  distress HEENT: normal Neck: no JVD Cardiac:  normal S1, S2; RRR; no murmurno ectopy Lungs:  clear to auscultation bilaterally, no wheezing, rhonchi or rales Abd: soft, nontender, no hepatomegalyobese Ext: trace edema Skin: warm and dry Neuro: no focal abnormalities noted  EKG:   07/08/14 - NSR with paced V.  08/02/13-heart rate 88 beats per minute Ventricular pacing, 68, occasional capture     ASSESSMENT AND PLAN:  1. Cardiomyopathy-improved EF to 50% on 08/18/13. Excellent. Medications reviewed. Lasix every other day 40 mg. I've also decreased his carvedilol to 6.25 mg twice a day. We will continue with low-dose losartan. 2. Dry cough-switched him from lisinopril to losartan 25 mg once a day. Mild improvement but he still feeling this. I asked to discuss this with his primary physician. This is likely from postnasal drip..  3. ICD-biventricular, doing well. Dr. Rayann Heman. He had lead fracture. Replaced with new lead. 4 leads in total. I reassured him that we would watch his battery life. 4. Chronic systolic left ventricular dysfunction/heart failure-appears fairly well compensated.  Lasix every other day , hypotension Urinary incontinence has been an issue. He may wish to discuss this with a urologist/PCP. 5. Decreased weight. Good Job with weight loss. He states that he does have decreased appetite. Continue to monitor this.  6. 6  Month follow-up  Signed, [name], MD [facility]  03/23/2015 8:05 AM

## 2015-08-30 ENCOUNTER — Ambulatory Visit (INDEPENDENT_AMBULATORY_CARE_PROVIDER_SITE_OTHER): Payer: Medicare Other | Admitting: Internal Medicine

## 2015-08-30 ENCOUNTER — Encounter: Payer: Self-pay | Admitting: Internal Medicine

## 2015-08-30 VITALS — BP 120/62 | HR 66 | Ht 72.0 in | Wt 217.2 lb

## 2015-08-30 DIAGNOSIS — I428 Other cardiomyopathies: Secondary | ICD-10-CM

## 2015-08-30 DIAGNOSIS — I5022 Chronic systolic (congestive) heart failure: Secondary | ICD-10-CM | POA: Diagnosis not present

## 2015-08-30 DIAGNOSIS — I429 Cardiomyopathy, unspecified: Secondary | ICD-10-CM | POA: Diagnosis not present

## 2015-08-30 NOTE — Progress Notes (Signed)
Primary Cardiologist: [name], MD  [name] is a 82 y.o. male who presents today for routine electrophysiology followup.  Since that time, the patient reports doing reasonably well. He remains active for his age.  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 Today, he denies symptoms of palpitations, chest pain, shortness of breath,  dizziness, presyncope,or  syncope.  The patient is otherwise without complaint today.   Past Medical History:  Diagnosis Date  . Chronic systolic congestive heart failure ([place])   . Depression   . DJD (degenerative joint disease)   . Implantable cardioverter-defibrillator lead failure    s/p lead revision in 2010 by Dr [name],  subsequent newly placed RV defibrillator lead has fractured.  ICD therapies programmed off 6/15  . Myasthenia gravis    mild  . Nonischemic cardiomyopathy (HCC)    EF improved from 20% to 50% with CRT  . Obesity   . Pickwickian syndrome ([place])   . Schwannoma    rested at [facility]   Past Surgical History:  Procedure Laterality Date  . BIV ICD GENERTAOR CHANGE OUT Left 05/23/2011   Procedure: BIV ICD GENERTAOR CHANGE OUT;  Surgeon: [name], MD;  Location: [facility] CATH LAB;  Service: Cardiovascular;  Laterality: Left;  . CARDIAC DEFIBRILLATOR PLACEMENT  09/13/05, 12/07/2007, 05/23/11   MDT BiV ICD implanted by Dr [name], His Fidelis [id number] lead fractured and was replaced by Dr [name] 12/07/2007, Generator change by Dr [name] 05/23/11  . schwannoma resection     removed at [facility]    Current Outpatient Prescriptions  Medication Sig Dispense Refill  . aspirin 81 MG tablet Take 1 tablet (81 mg total) by mouth daily.    . carvedilol (COREG) 6.25 MG tablet Take 1 tablet (6.25 mg total) by mouth 2 (two) times daily. 180 tablet 3  . Cholecalciferol (VITAMIN D3) 2000 UNITS TABS Take 1 tablet by mouth daily.    . cyanocobalamin 100 MCG tablet Take 100 mcg by mouth daily.    . cycloSPORINE modified (GENGRAF) 25 MG capsule Take 25 mg  by mouth 2 (two) times daily.    Marland Kitchen docusate sodium (COLACE) 100 MG capsule Take 100 mg by mouth daily as needed for mild constipation.    . furosemide (LASIX) 40 MG tablet Take 1 tablet (40 mg total) by mouth every other day. 90 tablet 3  . losartan (COZAAR) 25 MG tablet Take 1 tablet (25 mg total) by mouth daily. 90 tablet 3  . Multiple Vitamins-Minerals (MENS MULTI VITAMIN & MINERAL PO) Take 1 tablet by mouth daily.     No current facility-administered medications for this visit.    ROS- all systems reviewed and negative except as per HPI above  Physical Exam: Vitals:   08/30/15 1434  BP: 120/62  Pulse: 66  Weight: 217 lb 3.2 oz (98.5 kg)  Height: 6' (1.829 m)    GEN- The patient is well appearing, alert and oriented x 3 today.   Head- normocephalic, atraumatic Eyes-  Sclera clear, conjunctiva pink Ears- hearing intact Oropharynx- clear Lungs- Clear to ausculation bilaterally, normal work of breathing Chest- ICD pocket is well healed Heart- Regular rate and rhythm, no murmurs, rubs or gallops, PMI not laterally displaced GI- soft, NT, ND, + BS Extremities- no clubbing, cyanosis, +1 edema  ICD interrogation- reviewed in detail today,  See PACEART report  Assessment and Plan:  1. Nonischemic CM/ acute on chronic systolic dysfunction Stable No change required today He has responded previously to BiV pacing. Echo  2015 is reviewed  2. ICD lead fracture The patient has new out of range impedence on his ICD HV SVC and RV shock coils previously, suggestive of lead fracture.  The pace/ sense portion of the lead appears to be working fine at this time.   He previously had a lead fracture (MDT [place]) in 2009 for which he had a new MDT [employer] Quattro [phone] lead placed. He is noted to have 4 leads in his heart. We have therefore elected to leave tachy therapies off and are using his device as a biv pacemaker.  His LV lead is programmed to a bipolar tip/ring configuation. He is  pleased with current status.  3. Obesity Weight loss is advised  Return to see EP NP in 12 months Follow-up with Dr [name] as scheduled carelink monitoring is also encouraged today  [name] MD, [facility] 08/30/2015 3:17 PM

## 2015-08-30 NOTE — Patient Instructions (Addendum)
Medication Instructions:  Your physician recommends that you continue on your current medications as directed. Please refer to the Current Medication list given to you today.   Labwork: None ordered   Testing/Procedures: None ordered   Follow-Up: Your physician wants you to follow-up in: 12 months with Chanetta Marshall, NP You will receive a reminder letter in the mail two months in advance. If you don't receive a letter, please call our office to schedule the follow-up appointment.  Remote monitoring is used to monitor your  ICD from home. This monitoring reduces the number of office visits required to check your device to one time per year. It allows [place] to keep an eye on the functioning of your device to ensure it is working properly. You are scheduled for a device check from home on 11/29/15. You may send your transmission at any time that day. If you have a wireless device, the transmission will be sent automatically. After your physician reviews your transmission, you will receive a postcard with your next transmission date.     Any Other Special Instructions Will Be Listed Below (If Applicable).     If you need a refill on your cardiac medications before your next appointment, please call your pharmacy.

## 2015-09-06 ENCOUNTER — Encounter: Payer: Self-pay | Admitting: Cardiology

## 2015-09-21 ENCOUNTER — Ambulatory Visit (INDEPENDENT_AMBULATORY_CARE_PROVIDER_SITE_OTHER): Payer: Medicare Other | Admitting: Cardiology

## 2015-09-21 ENCOUNTER — Encounter: Payer: Self-pay | Admitting: Cardiology

## 2015-09-21 VITALS — BP 122/80 | HR 74 | Ht 71.0 in | Wt 222.2 lb

## 2015-09-21 DIAGNOSIS — I428 Other cardiomyopathies: Secondary | ICD-10-CM

## 2015-09-21 DIAGNOSIS — I5022 Chronic systolic (congestive) heart failure: Secondary | ICD-10-CM | POA: Diagnosis not present

## 2015-09-21 DIAGNOSIS — Z95810 Presence of automatic (implantable) cardiac defibrillator: Secondary | ICD-10-CM

## 2015-09-21 DIAGNOSIS — I429 Cardiomyopathy, unspecified: Secondary | ICD-10-CM

## 2015-09-21 DIAGNOSIS — E669 Obesity, unspecified: Secondary | ICD-10-CM | POA: Diagnosis not present

## 2015-09-21 NOTE — Patient Instructions (Signed)
Medication Instructions:  The current medical regimen is effective;  continue present plan and medications.  Follow-Up: Follow up in 6 months with Katy Thompson, PA.  You will receive a letter in the mail 2 months before you are due.  Please call us when you receive this letter to schedule your follow up appointment.  Follow up in 1 year with Dr. Skains.  You will receive a letter in the mail 2 months before you are due.  Please call us when you receive this letter to schedule your follow up appointment.  If you need a refill on your cardiac medications before your next appointment, please call your pharmacy.  Thank you for choosing [facility]!!     

## 2015-09-21 NOTE — Progress Notes (Signed)
[facility] [address], [address] [place], [place]  [address] Phone: [phone] Fax:  [phone]  Date:  09/21/2015   ID:  Farrel Gobble Landeck, DOB [birth year], MRN [id number]  PCP:  [name], MD   History of Present Illness: [place] A Margerum is a 82 y.o. male with prior nonischemic cardiomyopathy, biventricular ICD placed in 2007, upgraded in April of 2013 by Dr. Rayann Heman with ejection fraction increased from 20% now 35-40% doing very well. Denies any edema, orthopnea, syncope, PND. No strokelike symptoms.  Overactive bladder.  He gets up every 45 minutes at times to go to the bathroom at night. He also states that he has some dizziness at times when walking. Feels as though the room is in motion. Gait instability, vertiginous-like symptoms. Overactive bladder, insomnia.   After changing into ARB, his dry cough is mildly improved however he still has the sensation of needing to clear his throat. This may be postnasal drip.  Blood pressure was low in the past, improved after decreasing dosage of medication. See below. Medications changed. He has not had syncope. He does feel sluggish at times. Some trouble with walking. Main complaints are knee osteoarthritis, urinary frequency.  He is very pleased with how he is feeling currently. Occasionally will feel sensation in his chest that lasts a few seconds.   Wt Readings from Last 3 Encounters:  09/21/15 222 lb 3.2 oz (100.8 kg)  08/30/15 217 lb 3.2 oz (98.5 kg)  03/23/15 226 lb 6.4 oz (102.7 kg)     Past Medical History:  Diagnosis Date  . Chronic systolic congestive heart failure ([place])   . Depression   . DJD (degenerative joint disease)   . Implantable cardioverter-defibrillator lead failure    s/p lead revision in 2010 by Dr [name],  subsequent newly placed RV defibrillator lead has fractured.  ICD therapies programmed off 6/15  . Myasthenia gravis    mild  . Nonischemic cardiomyopathy (HCC)    EF improved from 20% to 50%  with CRT  . Obesity   . Pickwickian syndrome ([place])   . Schwannoma    rested at [facility]    Past Surgical History:  Procedure Laterality Date  . BIV ICD GENERTAOR CHANGE OUT Left 05/23/2011   Procedure: BIV ICD GENERTAOR CHANGE OUT;  Surgeon: [name], MD;  Location: [facility] CATH LAB;  Service: Cardiovascular;  Laterality: Left;  . CARDIAC DEFIBRILLATOR PLACEMENT  09/13/05, 12/07/2007, 05/23/11   MDT BiV ICD implanted by Dr [name], His Fidelis [id number] lead fractured and was replaced by Dr [name] 12/07/2007, Generator change by Dr [name] 05/23/11  . schwannoma resection     removed at [facility]    Current Outpatient Prescriptions  Medication Sig Dispense Refill  . furosemide (LASIX) 40 MG tablet Take 40 mg by mouth daily as needed for fluid or edema (patient states he rarely takes it).    Marland Kitchen aspirin 81 MG tablet Take 1 tablet (81 mg total) by mouth daily.    . carvedilol (COREG) 6.25 MG tablet Take 1 tablet (6.25 mg total) by mouth 2 (two) times daily. 180 tablet 3  . Cholecalciferol (VITAMIN D3) 2000 UNITS TABS Take 1 tablet by mouth daily.    . cyanocobalamin 100 MCG tablet Take 100 mcg by mouth daily.    . cycloSPORINE modified (GENGRAF) 25 MG capsule Take 25 mg by mouth 2 (two) times daily.    Marland Kitchen docusate sodium (COLACE) 100 MG capsule Take 100 mg by mouth daily as needed  for mild constipation.    Marland Kitchen losartan (COZAAR) 25 MG tablet Take 1 tablet (25 mg total) by mouth daily. 90 tablet 3  . Multiple Vitamins-Minerals (MENS MULTI VITAMIN & MINERAL PO) Take 1 tablet by mouth daily.     No current facility-administered medications for this visit.     Allergies:    Allergies  Allergen Reactions  . Penicillins Hives and Rash    Social History:  The patient  reports that he has never smoked. He has never used smokeless tobacco. He reports that he does not drink alcohol or use drugs.   ROS:  Please see the history of present illness.   Urinary frequency. Difficultly with balacne.  PHYSICAL EXAM: VS:   BP 122/80   Pulse 74   Ht 5\' 11"  (1.803 m)   Wt 222 lb 3.2 oz (100.8 kg)   BMI 30.99 kg/m  Well nourished, well developed, in no acute distress  HEENT: normal  Neck: no JVD  Cardiac:  normal S1, S2; RRR; no murmur no ectopy Lungs:  clear to auscultation bilaterally, no wheezing, rhonchi or rales  Abd: soft, nontender, no hepatomegaly obese Ext: trace edema  Skin: warm and dry  Neuro: no focal abnormalities noted  EKG:   EKG ordered today 09/21/15-sinus rhythm, ventricular pacing.  07/08/14 - NSR with paced V.  08/02/13-heart rate 88 beats per minute Ventricular pacing, 68, occasional capture     ECHO 08/18/13:  - Left ventricle: The cavity size was normal. Wall thickness was increased in a pattern of mild LVH. Systolic function was moderately reduced. The estimated ejection fraction was in the range of 35% to 40%. Diffuse hypokinesis. Features are consistent with a pseudonormal left ventricular filling pattern, with concomitant abnormal relaxation and increased filling pressure (grade 2 diastolic dysfunction). - Ventricular septum: There is marked septal-lateral and apical-basal dyssynchrony due to abnormal electrical activation. - Aortic valve: There was mild regurgitation. - Mitral valve: Calcified annulus. - Left atrium: The atrium was moderately dilated.  ASSESSMENT AND PLAN:  1. Cardiomyopathy-improved EF to 35-40% on 08/18/13.  Medications reviewed. Lasix PRN 40 mg. I've also decreased his carvedilol to 6.25 mg twice a day. We will continue with low-dose losartan. 2. Dry cough-switched him from lisinopril to losartan 25 mg once a day. Mild improvement but he still feeling this. I asked to discuss this with his primary physician. This is likely from postnasal drip..  3. ICD-biventricular, doing well. Dr. Rayann Heman. He had lead fracture. Replaced with new lead. 4 leads in total. I reassured him that we would watch his battery life. About 2 years left. 4. Chronic  systolic left ventricular dysfunction/heart failure-appears fairly well compensated.  Lasix PRN  Urinary incontinence has been an issue. He may wish to discuss this with a urologist/PCP. 5. Obesity-weight has been stable. 2 meals a day. 6. 6  Month follow-up with Katie, 12 with me.  Signed, [name], MD [facility]  09/21/2015 9:57 AM

## 2015-10-05 IMAGING — CR DG CHEST 2V
2 series · 2 of 2 positions shown · non-contrast
Comparison: Two-view chest 07/24/2010

CLINICAL DATA: AICD malfunction

EXAM:
CHEST  2 VIEW

[view not recorded (1 of 2)]
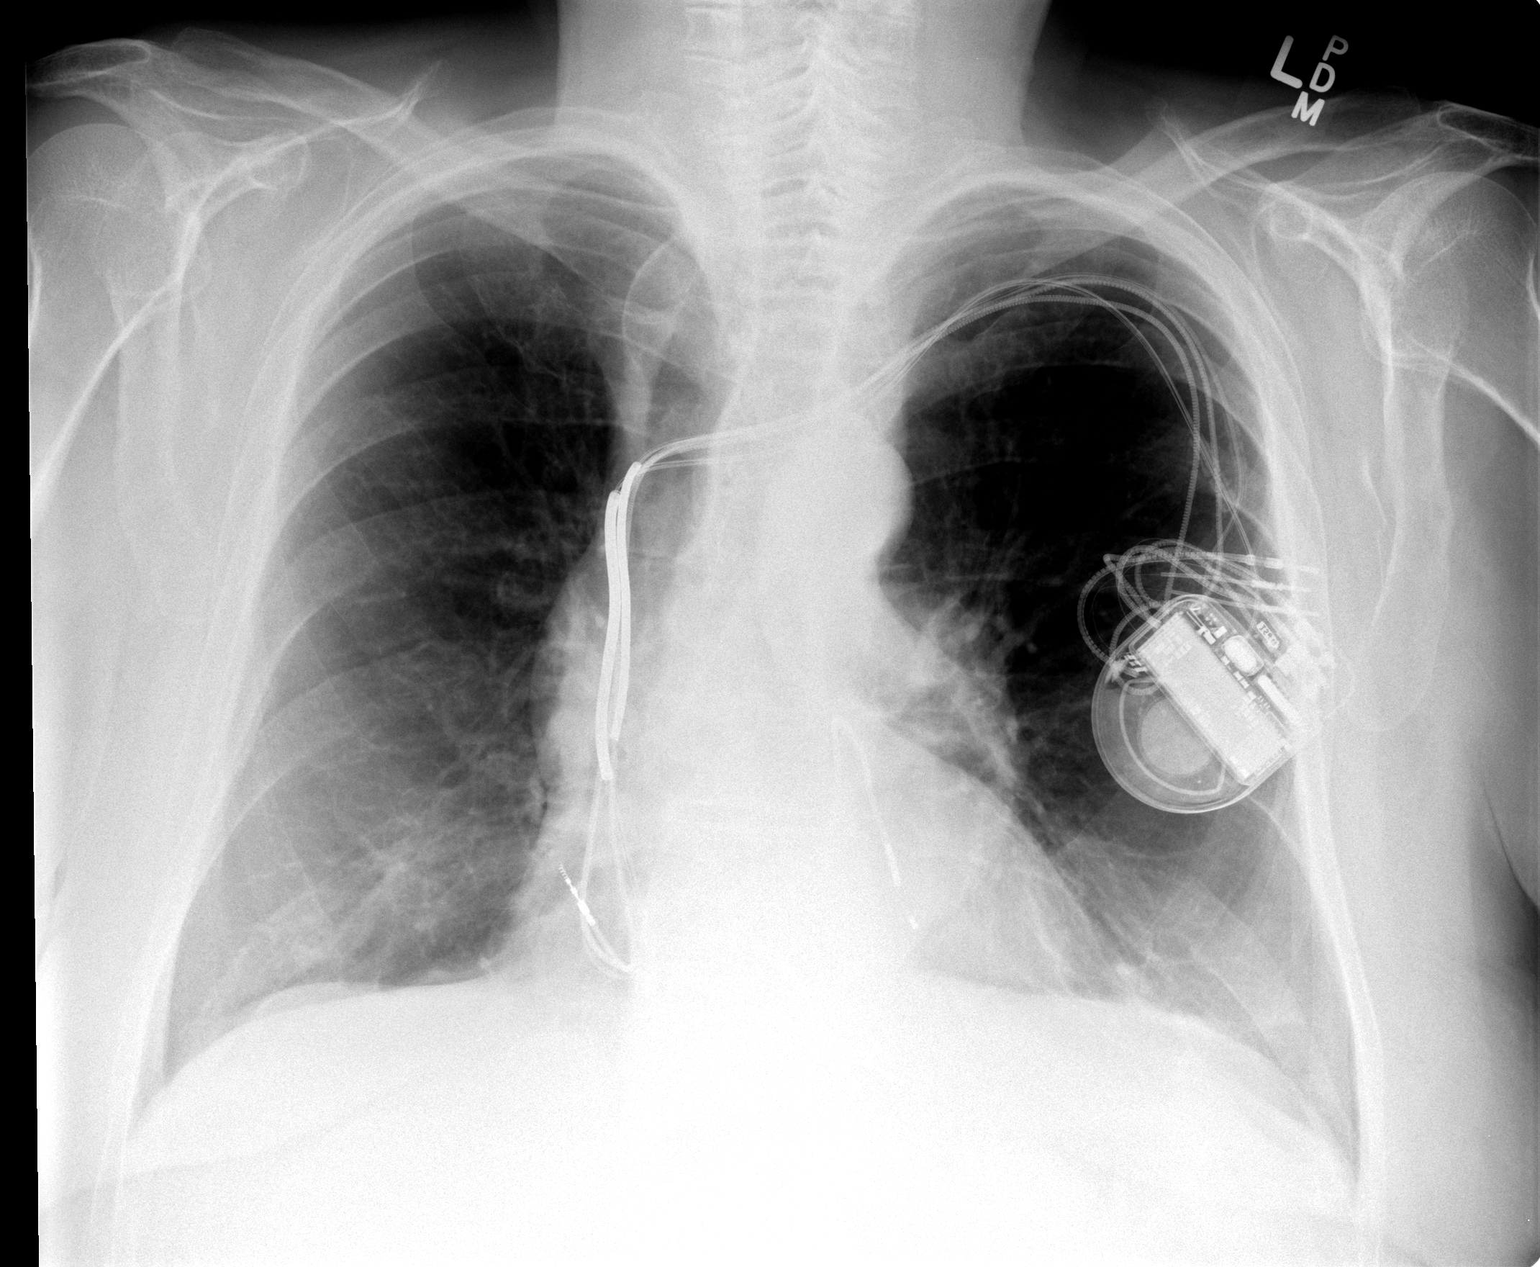

[view not recorded (2 of 2)]
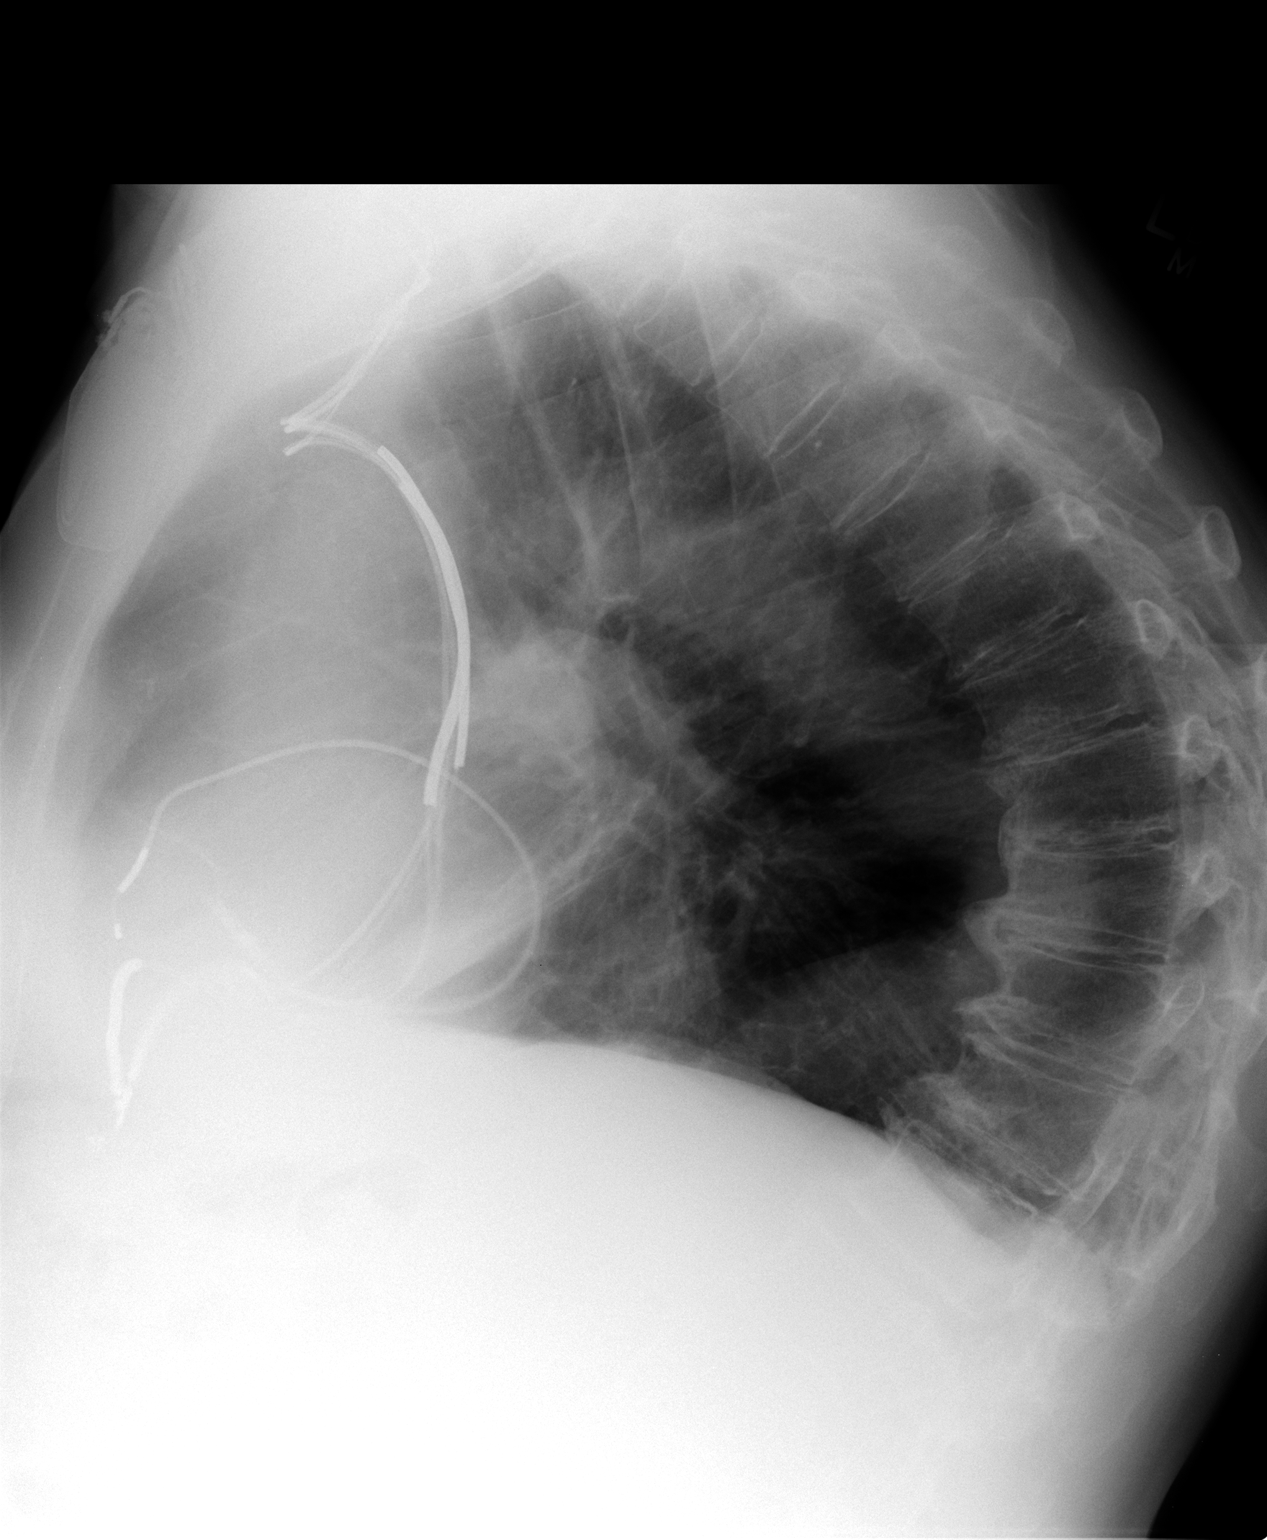

[2 of 2 positions shown; findings below may reference images not displayed]

FINDINGS: Cardiac silhouette is stable. Dual chamber left chest wall AICD is
unchanged. The leads also unchanged. The lungs are clear. Azygos
fissure appreciated. No acute osseous abnormalities. Stable chronic
compression deformities in the mid thoracic spine and increased
kyphosis.
IMPRESSION: Stable cardiomegaly. The AICD unit and leads also stable. No acute
cardiopulmonary disease.

## 2015-11-29 ENCOUNTER — Ambulatory Visit (INDEPENDENT_AMBULATORY_CARE_PROVIDER_SITE_OTHER): Payer: Medicare Other | Admitting: *Deleted

## 2015-11-29 DIAGNOSIS — I428 Other cardiomyopathies: Secondary | ICD-10-CM | POA: Diagnosis not present

## 2015-11-29 NOTE — Progress Notes (Signed)
Remote ICD transmission.   

## 2015-11-30 ENCOUNTER — Encounter: Payer: Self-pay | Admitting: Cardiology

## 2015-12-22 ENCOUNTER — Ambulatory Visit: Payer: Medicare Other | Admitting: *Deleted

## 2015-12-22 DIAGNOSIS — Z95810 Presence of automatic (implantable) cardiac defibrillator: Secondary | ICD-10-CM

## 2015-12-22 NOTE — Progress Notes (Signed)
PT seen as add on walk in- pt stated that he had fell 2 days ago, someone picked him up and he was concerned because his defibrillator site was sore. No swelling or bruising noted at site or around site. Informed pt that it was likely muscle soreness and if did not resolve in a week to call device clinic and his primary care doctor. Pt voiced understanding.

## 2015-12-27 LAB — CUP PACEART REMOTE DEVICE CHECK
Battery Voltage: 2.77 V
Brady Statistic AP VP Percent: 1.62 %
Brady Statistic AP VS Percent: 0.02 %
Brady Statistic AS VS Percent: 2.98 %
HIGH POWER IMPEDANCE MEASURED VALUE: 254 Ohm
HighPow Impedance: 254 Ohm
HighPow Impedance: 4047 Ohm
Implantable Lead Implant Date: 20070810
Implantable Lead Implant Date: 20091102
Implantable Lead Location: 753858
Implantable Lead Model: 5076
Lead Channel Impedance Value: 361 Ohm
Lead Channel Impedance Value: 4047 Ohm
Lead Channel Pacing Threshold Amplitude: 0.625 V
Lead Channel Pacing Threshold Amplitude: 1 V
Lead Channel Pacing Threshold Amplitude: 1.125 V
Lead Channel Sensing Intrinsic Amplitude: 10 mV
Lead Channel Sensing Intrinsic Amplitude: 10 mV
Lead Channel Sensing Intrinsic Amplitude: 2 mV
Lead Channel Setting Pacing Amplitude: 2 V
Lead Channel Setting Pacing Amplitude: 2.5 V
Lead Channel Setting Pacing Pulse Width: 0.4 ms
Lead Channel Setting Pacing Pulse Width: 0.8 ms
Lead Channel Setting Sensing Sensitivity: 0.3 mV
MDC IDC LEAD IMPLANT DT: 20070810
MDC IDC LEAD LOCATION: 753859
MDC IDC LEAD LOCATION: 753860
MDC IDC LEAD MODEL: 4194
MDC IDC MSMT LEADCHNL LV IMPEDANCE VALUE: 4047 Ohm
MDC IDC MSMT LEADCHNL LV IMPEDANCE VALUE: 931 Ohm
MDC IDC MSMT LEADCHNL LV PACING THRESHOLD PULSEWIDTH: 0.8 ms
MDC IDC MSMT LEADCHNL RA IMPEDANCE VALUE: 456 Ohm
MDC IDC MSMT LEADCHNL RA PACING THRESHOLD PULSEWIDTH: 0.4 ms
MDC IDC MSMT LEADCHNL RA SENSING INTR AMPL: 2 mV
MDC IDC MSMT LEADCHNL RV PACING THRESHOLD PULSEWIDTH: 0.4 ms
MDC IDC PG IMPLANT DT: 20130418
MDC IDC SESS DTM: 20171025062607
MDC IDC SET LEADCHNL RA PACING AMPLITUDE: 2 V
MDC IDC STAT BRADY AS VP PERCENT: 95.38 %
MDC IDC STAT BRADY RA PERCENT PACED: 1.64 %
MDC IDC STAT BRADY RV PERCENT PACED: 97 %

## 2016-02-28 ENCOUNTER — Ambulatory Visit (INDEPENDENT_AMBULATORY_CARE_PROVIDER_SITE_OTHER): Payer: Medicare Other | Admitting: *Deleted

## 2016-02-28 ENCOUNTER — Telehealth: Payer: Self-pay | Admitting: Cardiology

## 2016-02-28 DIAGNOSIS — I428 Other cardiomyopathies: Secondary | ICD-10-CM

## 2016-02-28 NOTE — Telephone Encounter (Signed)
LMOVM reminding pt to send remote transmission.   

## 2016-03-29 ENCOUNTER — Encounter: Payer: Self-pay | Admitting: Cardiology

## 2016-03-29 NOTE — Progress Notes (Signed)
Remote ICD transmission.   

## 2016-04-02 LAB — CUP PACEART REMOTE DEVICE CHECK
Battery Voltage: 2.71 V
Brady Statistic AP VP Percent: 0.8 %
Brady Statistic AS VP Percent: 95.42 %
Brady Statistic AS VS Percent: 3.77 %
Date Time Interrogation Session: 20180124083631
HIGH POWER IMPEDANCE MEASURED VALUE: 254 Ohm
HIGH POWER IMPEDANCE MEASURED VALUE: 4047 Ohm
HighPow Impedance: 254 Ohm
Implantable Lead Implant Date: 20070810
Implantable Lead Implant Date: 20091102
Implantable Lead Location: 753858
Implantable Lead Location: 753859
Implantable Lead Model: 4194
Implantable Lead Model: 5076
Implantable Lead Model: 6947
Lead Channel Impedance Value: 361 Ohm
Lead Channel Impedance Value: 418 Ohm
Lead Channel Impedance Value: 931 Ohm
Lead Channel Pacing Threshold Amplitude: 0.5 V
Lead Channel Pacing Threshold Amplitude: 1 V
Lead Channel Pacing Threshold Amplitude: 1.375 V
Lead Channel Pacing Threshold Pulse Width: 0.4 ms
Lead Channel Pacing Threshold Pulse Width: 0.8 ms
Lead Channel Sensing Intrinsic Amplitude: 1.875 mV
Lead Channel Sensing Intrinsic Amplitude: 9.75 mV
Lead Channel Sensing Intrinsic Amplitude: 9.75 mV
Lead Channel Setting Pacing Amplitude: 2.5 V
Lead Channel Setting Pacing Amplitude: 2.5 V
Lead Channel Setting Pacing Pulse Width: 0.4 ms
Lead Channel Setting Pacing Pulse Width: 0.8 ms
MDC IDC LEAD IMPLANT DT: 20070810
MDC IDC LEAD LOCATION: 753860
MDC IDC MSMT LEADCHNL LV IMPEDANCE VALUE: 4047 Ohm
MDC IDC MSMT LEADCHNL LV IMPEDANCE VALUE: 4047 Ohm
MDC IDC MSMT LEADCHNL RA PACING THRESHOLD PULSEWIDTH: 0.4 ms
MDC IDC MSMT LEADCHNL RA SENSING INTR AMPL: 1.875 mV
MDC IDC PG IMPLANT DT: 20130418
MDC IDC SET LEADCHNL RA PACING AMPLITUDE: 2 V
MDC IDC SET LEADCHNL RV SENSING SENSITIVITY: 0.3 mV
MDC IDC STAT BRADY AP VS PERCENT: 0.02 %
MDC IDC STAT BRADY RA PERCENT PACED: 0.81 %
MDC IDC STAT BRADY RV PERCENT PACED: 91.39 %

## 2016-04-29 ENCOUNTER — Other Ambulatory Visit: Payer: Self-pay | Admitting: Cardiology

## 2016-07-02 ENCOUNTER — Ambulatory Visit (INDEPENDENT_AMBULATORY_CARE_PROVIDER_SITE_OTHER): Payer: Medicare Other | Admitting: *Deleted

## 2016-07-02 DIAGNOSIS — I428 Other cardiomyopathies: Secondary | ICD-10-CM

## 2016-07-02 NOTE — Progress Notes (Signed)
Remote ICD transmission.   

## 2016-07-03 LAB — CUP PACEART REMOTE DEVICE CHECK
Brady Statistic AP VS Percent: 0.02 %
Brady Statistic AS VP Percent: 91.47 %
Brady Statistic AS VS Percent: 7.7 %
HIGH POWER IMPEDANCE MEASURED VALUE: 254 Ohm
HIGH POWER IMPEDANCE MEASURED VALUE: 254 Ohm
HighPow Impedance: 4047 Ohm
Implantable Lead Implant Date: 20070810
Implantable Lead Implant Date: 20091102
Implantable Lead Location: 753858
Implantable Lead Location: 753859
Implantable Lead Model: 5076
Implantable Pulse Generator Implant Date: 20130418
Lead Channel Impedance Value: 399 Ohm
Lead Channel Impedance Value: 4047 Ohm
Lead Channel Impedance Value: 475 Ohm
Lead Channel Impedance Value: 950 Ohm
Lead Channel Pacing Threshold Amplitude: 0.625 V
Lead Channel Pacing Threshold Amplitude: 1 V
Lead Channel Pacing Threshold Amplitude: 1.25 V
Lead Channel Pacing Threshold Pulse Width: 0.8 ms
Lead Channel Sensing Intrinsic Amplitude: 2.75 mV
Lead Channel Sensing Intrinsic Amplitude: 9.25 mV
Lead Channel Sensing Intrinsic Amplitude: 9.25 mV
Lead Channel Setting Pacing Amplitude: 2.25 V
Lead Channel Setting Pacing Amplitude: 2.5 V
Lead Channel Setting Pacing Pulse Width: 0.4 ms
Lead Channel Setting Pacing Pulse Width: 0.8 ms
MDC IDC LEAD IMPLANT DT: 20070810
MDC IDC LEAD LOCATION: 753860
MDC IDC MSMT BATTERY VOLTAGE: 2.64 V
MDC IDC MSMT LEADCHNL LV IMPEDANCE VALUE: 4047 Ohm
MDC IDC MSMT LEADCHNL RA PACING THRESHOLD PULSEWIDTH: 0.4 ms
MDC IDC MSMT LEADCHNL RA SENSING INTR AMPL: 2.75 mV
MDC IDC MSMT LEADCHNL RV PACING THRESHOLD PULSEWIDTH: 0.4 ms
MDC IDC SESS DTM: 20180529062509
MDC IDC SET LEADCHNL RA PACING AMPLITUDE: 2 V
MDC IDC SET LEADCHNL RV SENSING SENSITIVITY: 0.3 mV
MDC IDC STAT BRADY AP VP PERCENT: 0.81 %
MDC IDC STAT BRADY RA PERCENT PACED: 0.83 %
MDC IDC STAT BRADY RV PERCENT PACED: 85.67 %

## 2016-07-05 ENCOUNTER — Encounter: Payer: Self-pay | Admitting: Cardiology

## 2016-09-19 ENCOUNTER — Telehealth: Payer: Self-pay | Admitting: Cardiology

## 2016-09-19 MED ORDER — CARVEDILOL 6.25 MG PO TABS: 6.2500 mg | ORAL_TABLET | Freq: Two times a day (BID) | ORAL | 1 refills | Status: DC

## 2016-09-19 NOTE — Telephone Encounter (Signed)
NEW MESSAGE   *STAT* If patient is at the pharmacy, call can be transferred to refill team.   1. Which medications need to be refilled? (please list name of each medication and dose if known)  CARVEDILOL 6.25MG   2. Which pharmacy/location (including street and city if local pharmacy) is medication to be sent to? [place]  3. Do they need a 30 day or 90 day supply?  90DAY

## 2016-09-19 NOTE — Telephone Encounter (Signed)
Pt's medication was sent to pt's pharmacy as requested. Confirmation received.  °

## 2016-10-01 ENCOUNTER — Ambulatory Visit (INDEPENDENT_AMBULATORY_CARE_PROVIDER_SITE_OTHER): Payer: Medicare Other | Admitting: *Deleted

## 2016-10-01 DIAGNOSIS — I428 Other cardiomyopathies: Secondary | ICD-10-CM

## 2016-10-01 NOTE — Progress Notes (Signed)
Remote ICD transmission.   

## 2016-10-02 LAB — CUP PACEART REMOTE DEVICE CHECK
Battery Voltage: 2.62 V
Brady Statistic AP VP Percent: 1.61 %
Brady Statistic AS VS Percent: 3.16 %
Brady Statistic RA Percent Paced: 1.62 %
Brady Statistic RV Percent Paced: 90.78 %
Date Time Interrogation Session: 20180828041607
HIGH POWER IMPEDANCE MEASURED VALUE: 254 Ohm
HIGH POWER IMPEDANCE MEASURED VALUE: 4047 Ohm
HighPow Impedance: 254 Ohm
Implantable Lead Implant Date: 20070810
Implantable Lead Implant Date: 20070810
Implantable Lead Implant Date: 20091102
Implantable Lead Location: 753860
Implantable Lead Model: 5076
Implantable Lead Model: 6947
Implantable Pulse Generator Implant Date: 20130418
Lead Channel Impedance Value: 4047 Ohm
Lead Channel Impedance Value: 988 Ohm
Lead Channel Pacing Threshold Amplitude: 0.625 V
Lead Channel Pacing Threshold Amplitude: 1.25 V
Lead Channel Pacing Threshold Pulse Width: 0.4 ms
Lead Channel Pacing Threshold Pulse Width: 0.8 ms
Lead Channel Sensing Intrinsic Amplitude: 2.5 mV
Lead Channel Sensing Intrinsic Amplitude: 8.875 mV
Lead Channel Sensing Intrinsic Amplitude: 8.875 mV
Lead Channel Setting Pacing Amplitude: 2.25 V
Lead Channel Setting Pacing Amplitude: 2.5 V
Lead Channel Setting Pacing Pulse Width: 0.8 ms
Lead Channel Setting Sensing Sensitivity: 0.3 mV
MDC IDC LEAD LOCATION: 753858
MDC IDC LEAD LOCATION: 753859
MDC IDC MSMT LEADCHNL LV IMPEDANCE VALUE: 4047 Ohm
MDC IDC MSMT LEADCHNL LV PACING THRESHOLD AMPLITUDE: 1.125 V
MDC IDC MSMT LEADCHNL RA IMPEDANCE VALUE: 475 Ohm
MDC IDC MSMT LEADCHNL RA PACING THRESHOLD PULSEWIDTH: 0.4 ms
MDC IDC MSMT LEADCHNL RA SENSING INTR AMPL: 2.5 mV
MDC IDC MSMT LEADCHNL RV IMPEDANCE VALUE: 361 Ohm
MDC IDC SET LEADCHNL RA PACING AMPLITUDE: 2 V
MDC IDC SET LEADCHNL RV PACING PULSEWIDTH: 0.4 ms
MDC IDC STAT BRADY AP VS PERCENT: 0.02 %
MDC IDC STAT BRADY AS VP PERCENT: 95.21 %

## 2016-10-11 ENCOUNTER — Encounter: Payer: Self-pay | Admitting: Cardiology

## 2016-10-16 NOTE — Progress Notes (Signed)
Electrophysiology Office Note Date: 10/17/2016  ID:  [name], DOB 08/04/1933, MRN [id number]  PCP: [name], MD Primary Cardiologist: [name] Electrophysiologist: [place]  CC: Pacemaker follow-up  [name] is a 83 y.o. male seen today for [name].  He presents today for routine electrophysiology followup.  Since last being seen in our clinic, the patient reports doing reasonably well.  His knees have been giving him more trouble recently. He is planning a trip to [place] in the next couple of months.  He denies chest pain, palpitations, PND, orthopnea, nausea, vomiting, dizziness, syncope, edema, weight gain, or early satiety.  Device History: MDT CRTD implanted 2007 for NICM, CHF; RV lead revision 2009 (fractured 6949 lead); gen change 2013 (6947 lead now with SVC and RV coils high impedance; tachy therapies turned off)   Past Medical History:  Diagnosis Date  . Chronic systolic congestive heart failure ([place])   . Depression   . DJD (degenerative joint disease)   . Implantable cardioverter-defibrillator lead failure    s/p lead revision in 2010 by Dr [name],  subsequent newly placed RV defibrillator lead has fractured.  ICD therapies programmed off 6/15  . Myasthenia gravis    mild  . Nonischemic cardiomyopathy (HCC)    EF improved from 20% to 50% with CRT  . Obesity   . Pickwickian syndrome ([place])   . Schwannoma    rested at [facility]   Past Surgical History:  Procedure Laterality Date  . BIV ICD GENERTAOR CHANGE OUT Left 05/23/2011   Procedure: BIV ICD GENERTAOR CHANGE OUT;  Surgeon: [name], MD;  Location: [facility] CATH LAB;  Service: Cardiovascular;  Laterality: Left;  . CARDIAC DEFIBRILLATOR PLACEMENT  09/13/05, 12/07/2007, 05/23/11   MDT BiV ICD implanted by Dr [name], His Fidelis [phone] lead fractured and was replaced by Dr [name] 12/07/2007, Generator change by Dr [name] 05/23/11  . schwannoma resection     removed at [facility]    Current Outpatient  Prescriptions  Medication Sig Dispense Refill  . aspirin 81 MG tablet Take 1 tablet (81 mg total) by mouth daily.    . carvedilol (COREG) 6.25 MG tablet Take 1 tablet (6.25 mg total) by mouth 2 (two) times daily. 180 tablet 1  . Cholecalciferol (VITAMIN D3) 2000 UNITS TABS Take 1 tablet by mouth daily.    . cyanocobalamin 100 MCG tablet Take 100 mcg by mouth daily.    . cycloSPORINE modified (GENGRAF) 25 MG capsule Take 25 mg by mouth 2 (two) times daily.    Marland Kitchen docusate sodium (COLACE) 100 MG capsule Take 100 mg by mouth daily as needed for mild constipation.    . furosemide (LASIX) 40 MG tablet Take 40 mg by mouth daily as needed for fluid or edema (patient states he rarely takes it).    Marland Kitchen losartan (COZAAR) 25 MG tablet take 1 tablet by mouth once daily 90 tablet 1  . Multiple Vitamins-Minerals (MENS MULTI VITAMIN & MINERAL PO) Take 1 tablet by mouth daily.     No current facility-administered medications for this visit.     Allergies:   Penicillins   Social History: Social History   Social History  . Marital status: Divorced    Spouse name: N/A  . Number of children: N/A  . Years of education: N/A   Occupational History  . Not on file.   Social History Main Topics  . Smoking status: Never Smoker  . Smokeless tobacco: Never Used  . Alcohol use No  .  Drug use: No  . Sexual activity: Not on file   Other Topics Concern  . Not on file   Social History Narrative  . No narrative on file    Family History: Family History  Problem Relation Age of Onset  . Lung disease Father   . Heart attack Neg Hx   . Stroke Neg Hx      Review of Systems: All other systems reviewed and are otherwise negative except as noted above.   Physical Exam: VS:  BP 100/64   Pulse 69   Ht 6' (1.829 m)   Wt 228 lb (103.4 kg)   SpO2 99%   BMI 30.92 kg/m  , BMI Body mass index is 30.92 kg/m.  GEN- The patient is elderly and obese appearing, alert and oriented x 3 today.   HEENT:  normocephalic, atraumatic; sclera clear, conjunctiva pink; hearing intact; oropharynx clear; neck supple  Lungs- Clear to ausculation bilaterally, normal work of breathing.  No wheezes, rales, rhonchi Heart- Regular rate and rhythm (paced) GI- soft, non-tender, non-distended, bowel sounds present  Extremities- no clubbing, cyanosis, +dependent edema MS- no significant deformity or atrophy Skin- warm and dry, no rash or lesion; PPM pocket well healed Psych- euthymic mood, full affect Neuro- strength and sensation are intact  PPM Interrogation- reviewed in detail today,  See PACEART report  EKG:  EKG is ordered today. The ekg ordered today shows sinus rhythm with CRT pacing, PVC's  Recent Labs: No results found for requested labs within last 8760 hours.   Wt Readings from Last 3 Encounters:  10/17/16 228 lb (103.4 kg)  09/21/15 222 lb 3.2 oz (100.8 kg)  08/30/15 217 lb 3.2 oz (98.5 kg)     Other studies Reviewed: Additional studies/ records that were reviewed today include: [name] and Dr [name]' office notes  Assessment and Plan:  1.  Chronic systolic heart failure/NICM CRTD functioning as CRTP with HV coils fractured on 6947 lead. Pace/sense portion of lead still working normally.  Tachy therapies programmed off. Pt currently has 2 RV leads in place (previous 6949 lead and 6947 lead).  Prior discussions with [name] were to leave device functioning as CRTP Euvolemic on exam See Claudia Desanctis Art report ICD at ERI (battery voltage 2.62V).  He is planning a trip to [place] in the next couple of months and would like to wait until after that trip for gen change.  This will be changing CRTD to CRTP. I have reviewed risks/benefits with the patient today. He will call back to schedule once he has his trip to [place] planned.    2.  Obesity Body mass index is 30.92 kg/m. Weight loss advised   Current medicines are reviewed at length with the patient today.   The patient does not have  concerns regarding his medicines.  The following changes were made today:  none  Labs/ tests ordered today include: none No orders of the defined types were placed in this encounter.    Disposition:   Follow up with [facility], [name] after gen change, Dr [name] as scheduled     Signed, Chanetta Marshall, NP 10/17/2016 10:58 AM  [facility] [address] [place] [address] [phone] (office) [phone] (fax)

## 2016-10-17 ENCOUNTER — Ambulatory Visit (INDEPENDENT_AMBULATORY_CARE_PROVIDER_SITE_OTHER): Payer: Medicare Other | Admitting: Nurse Practitioner

## 2016-10-17 ENCOUNTER — Encounter: Payer: Self-pay | Admitting: Nurse Practitioner

## 2016-10-17 VITALS — BP 100/64 | HR 69 | Ht 72.0 in | Wt 228.0 lb

## 2016-10-17 DIAGNOSIS — I5022 Chronic systolic (congestive) heart failure: Secondary | ICD-10-CM | POA: Diagnosis not present

## 2016-10-17 NOTE — Patient Instructions (Addendum)
Medication Instructions:   Your physician recommends that you continue on your current medications as directed. Please refer to the Current Medication list given to you today.   If you need a refill on your cardiac medications before your next appointment, please call your pharmacy.  Labwork: NONE ORDERED  TODAY    Testing/Procedures: NONE ORDERED  TODAY    Follow-Up:    CONTACT OFFICE ONCE YOUR TRIP HAS BEEN SCHEDULED SO THAT YOU CAN SET UP FOR A GEN CHANGE ON YOUR DEVIC     Any Other Special Instructions Will Be Listed Below (If Applicable).

## 2016-11-11 LAB — CUP PACEART INCLINIC DEVICE CHECK
Date Time Interrogation Session: 20181008075048
Implantable Lead Implant Date: 20070810
Implantable Lead Location: 753858
Implantable Lead Location: 753859
Implantable Lead Model: 4194
Implantable Lead Model: 6947
MDC IDC LEAD IMPLANT DT: 20070810
MDC IDC LEAD IMPLANT DT: 20091102
MDC IDC LEAD LOCATION: 753860
MDC IDC PG IMPLANT DT: 20130418

## 2016-12-02 ENCOUNTER — Other Ambulatory Visit: Payer: Self-pay | Admitting: Cardiology

## 2016-12-13 ENCOUNTER — Encounter: Payer: Self-pay | Admitting: Cardiology

## 2016-12-24 ENCOUNTER — Encounter: Payer: Self-pay | Admitting: Cardiology

## 2016-12-24 ENCOUNTER — Encounter (INDEPENDENT_AMBULATORY_CARE_PROVIDER_SITE_OTHER): Payer: Self-pay

## 2016-12-24 ENCOUNTER — Ambulatory Visit (INDEPENDENT_AMBULATORY_CARE_PROVIDER_SITE_OTHER): Payer: Medicare Other | Admitting: Cardiology

## 2016-12-24 VITALS — BP 108/70 | HR 66 | Ht 72.0 in | Wt 230.6 lb

## 2016-12-24 DIAGNOSIS — I428 Other cardiomyopathies: Secondary | ICD-10-CM | POA: Diagnosis not present

## 2016-12-24 DIAGNOSIS — I5022 Chronic systolic (congestive) heart failure: Secondary | ICD-10-CM | POA: Diagnosis not present

## 2016-12-24 DIAGNOSIS — Z95810 Presence of automatic (implantable) cardiac defibrillator: Secondary | ICD-10-CM

## 2016-12-24 NOTE — Progress Notes (Signed)
[name]. [address], [address] [place], [place]  [address] Phone: [phone] Fax:  [phone]  Date:  12/24/2016   ID:  [name], DOB 1933/10/12, MRN [id number]  PCP:  Shaun Amel, MD   History of Present Illness: [name] is a 83 y.o. male with nonischemic cardiomyopathy, biventricular ICD placed in 2007, upgraded in April of 2013 by Dr. Rayann Heman with ejection fraction increased from 20% now 35-40% doing very well.   Overactive bladder.  He gets up every 45 minutes at times to go to the bathroom at night.  After changing into ARB, his dry cough is mildly improved however he still has the sensation of needing to clear his throat. This may be postnasal drip.  Blood pressure was low in the past, improved after decreasing dosage of medication.  12/24/16-overall doing well.  He does have some minor lower extremity edema chronic.  He also has some lower extremity leg weakness that has been chronic as well.  His biventricular ICD is ERI with battery voltage 2.6 back in October 17, 2016.  He would like to get this changed out in February.  He is back from [place].  Quick trip.  He denies any syncope, bleeding, orthopnea, PND.  Wt Readings from Last 3 Encounters:  12/24/16 230 lb 9.6 oz (104.6 kg)  10/17/16 228 lb (103.4 kg)  09/21/15 222 lb 3.2 oz (100.8 kg)     Past Medical History:  Diagnosis Date  . Chronic systolic congestive heart failure ([place])   . Depression   . DJD (degenerative joint disease)   . Implantable cardioverter-defibrillator lead failure    s/p lead revision in 2010 by Dr [name],  subsequent newly placed RV defibrillator lead has fractured.  ICD therapies programmed off 6/15  . Myasthenia gravis    mild  . Nonischemic cardiomyopathy (HCC)    EF improved from 20% to 50% with CRT  . Obesity   . Pickwickian syndrome ([place])   . Schwannoma    rested at [facility]    Past Surgical History:  Procedure Laterality Date  . BIV ICD GENERTAOR CHANGE  OUT Left 05/23/2011   Performed by [name], MD at [facility] CATH LAB  . CARDIAC DEFIBRILLATOR PLACEMENT  09/13/05, 12/07/2007, 05/23/11   MDT BiV ICD implanted by Dr [name], His Fidelis [id number] lead fractured and was replaced by Dr [name] 12/07/2007, Generator change by Dr [name] 05/23/11  . schwannoma resection     removed at [facility]    Current Outpatient Medications  Medication Sig Dispense Refill  . aspirin 81 MG tablet Take 1 tablet (81 mg total) by mouth daily.    . carvedilol (COREG) 6.25 MG tablet Take 1 tablet (6.25 mg total) by mouth 2 (two) times daily. 180 tablet 1  . Cholecalciferol (VITAMIN D3) 2000 UNITS TABS Take 1 tablet by mouth daily.    . cyanocobalamin 100 MCG tablet Take 100 mcg by mouth daily.    . cycloSPORINE modified (GENGRAF) 25 MG capsule Take 25 mg by mouth 2 (two) times daily.    Marland Kitchen docusate sodium (COLACE) 100 MG capsule Take 100 mg by mouth daily as needed for mild constipation.    . furosemide (LASIX) 40 MG tablet Take 40 mg by mouth daily as needed for fluid or edema (patient states he rarely takes it).    Marland Kitchen losartan (COZAAR) 25 MG tablet TAKE 1 TABLET BY MOUTH ONCE DAILY 90 tablet 3  . Multiple Vitamins-Minerals (MENS MULTI VITAMIN &  MINERAL PO) Take 1 tablet by mouth daily.     No current facility-administered medications for this visit.     Allergies:    Allergies  Allergen Reactions  . Penicillins Hives and Rash    Social History:  The patient  reports that  has never smoked. he has never used smokeless tobacco. He reports that he does not drink alcohol or use drugs.   ROS:  Please see the history of present illness.   Urinary frequency. Difficultly with balance, unchanged  PHYSICAL EXAM: VS:  BP 108/70   Pulse 66   Ht 6' (1.829 m)   Wt 230 lb 9.6 oz (104.6 kg)   SpO2 97%   BMI 31.27 kg/m  GEN: Well nourished, well developed, in no acute distress  HEENT: normal  Neck: no JVD, carotid bruits, or masses Cardiac: RRR; no murmurs, rubs, or gallops,no  edema  Respiratory:  clear to auscultation bilaterally, normal work of breathing GI: soft, nontender, nondistended, + BS MS: no deformity or atrophy  Skin: warm and dry, no rash Neuro:  Alert and Oriented x 3, Strength and sensation are intact Psych: euthymic mood, full affect   EKG:   EKG ordered 09/21/15-sinus rhythm, ventricular pacing.  07/08/14 - NSR with paced V.  08/02/13-heart rate 88 beats per minute Ventricular pacing, 68, occasional capture     ECHO 08/18/13:  - Left ventricle: The cavity size was normal. Wall thickness was increased in a pattern of mild LVH. Systolic function was moderately reduced. The estimated ejection fraction was in the range of 35% to 40%. Diffuse hypokinesis. Features are consistent with a pseudonormal left ventricular filling pattern, with concomitant abnormal relaxation and increased filling pressure (grade 2 diastolic dysfunction). - Ventricular septum: There is marked septal-lateral and apical-basal dyssynchrony due to abnormal electrical activation. - Aortic valve: There was mild regurgitation. - Mitral valve: Calcified annulus. - Left atrium: The atrium was moderately dilated.  ASSESSMENT AND PLAN:  1. Cardiomyopathy-improved EF to 35-40% on 08/18/13 after biventricular pacing.  Medications reviewed. Lasix PRN 40 mg. I've also decreased his carvedilol to 6.25 mg twice a day previously. We will continue with low-dose losartan.  Seems to be fairly well compensated. 2. Dry cough-switched him from lisinopril to losartan 25 mg once a day.  Improved.  Sometimes clears his throat. 3. ICD-biventricular, doing well. Dr. Rayann Heman. He had lead fracture. Replaced with new lead. 4 leads in total.  He is currently at [facility].  He would like to get his device changed out in February 2019.  I will send a message to device clinic/[facility].  We will not be replacing with defibrillator type device.  CRT pacing only. 4. Chronic systolic left ventricular  dysfunction/heart failure-appears fairly well compensated.  Lasix PRN  Urinary incontinence continues to be an issue.  5. Obesity-weight has been stable. 2 meals a day. 6. 6  Month follow-up with Cecille Rubin, 12 with me.  Signed, [name], MD [facility]  12/24/2016 10:33 AM

## 2016-12-24 NOTE — Patient Instructions (Signed)
Medication Instructions:  The current medical regimen is effective;  continue present plan and medications.  Follow-Up: Follow up in 6 months with Lori Gerhardt, NP.  You will receive a letter in the mail 2 months before you are due.  Please call us when you receive this letter to schedule your follow up appointment.  Follow up in 1 year with Dr. Skains.  You will receive a letter in the mail 2 months before you are due.  Please call us when you receive this letter to schedule your follow up appointment.  If you need a refill on your cardiac medications before your next appointment, please call your pharmacy.  Thank you for choosing [facility]!!     

## 2017-02-02 ENCOUNTER — Other Ambulatory Visit: Payer: Self-pay | Admitting: Cardiology

## 2017-02-26 ENCOUNTER — Telehealth: Payer: Self-pay | Admitting: Cardiology

## 2017-02-26 NOTE — Telephone Encounter (Signed)
1. ICD-biventricular, doing well. Dr. Rayann Heman. He had lead fracture. Replaced with new lead. 4 leads in total.  He is currently at [facility].  He would like to get his device changed out in February 2019.  I will send a message to device clinic/Amber.  We will not be replacing with defibrillator type device.  CRT pacing only.  Spoke with daughter Wilhemena Durie.  DPR on file. Daughter would like to go ahead and schedule procedure.   Advised I will forward this information to Amber for f/u and to determine next steps.  Daughter states understanding.

## 2017-02-26 NOTE — Telephone Encounter (Signed)
New Message   Wilhemena Durie is calling on behalf of father. She states at the patients last appointment Dr. [name] discuss changing the battery in the patients defibrillator. They her calling to discuss. Please call.

## 2017-02-27 NOTE — Telephone Encounter (Signed)
Will send to [facility] to schedule gen change - downgrade from CRTD to [facility], NP 02/27/2017 8:44 PM

## 2017-02-28 NOTE — Telephone Encounter (Signed)
Returned call to daughter and she is looking at 2/15 for her fathers procedure.  I let her know [name] is not in the hospital that day and gave her the following days to consider: 2/8,2/12,2/19,2/26,or 2/28.  She is going to check with her brother and call me back Monday with a date that works for them.

## 2017-02-28 NOTE — Telephone Encounter (Signed)
Follow up  Pt's daughter is requested for the defib battery change be 03/22/2017 am please call

## 2017-02-28 NOTE — Telephone Encounter (Signed)
Patients daughter called and will do 03/14/17. Let her know I would schedule on Monday after talking with [name]

## 2017-03-03 NOTE — Telephone Encounter (Signed)
Called and have patient scheduled for a downgrade to CRTP.  He will need to be at the hospital at 12:30.  [name] will do H&P and he will have labs at the hospital day off procedure  Please arrive at [facility] on 03/14/17 at [place] to have a lite breakfast the morning of the procedure but NPO after 7:30am Do not take any medications the morning of the test Will need someone to drive you home at discharge   I have left the above on his daughters voicemail

## 2017-03-14 ENCOUNTER — Ambulatory Visit (HOSPITAL_COMMUNITY)
Admission: RE | Disposition: A | Discharge status: Home / Self Care | Payer: Self-pay | Source: Ambulatory Visit | Attending: Internal Medicine

## 2017-03-14 ENCOUNTER — Other Ambulatory Visit: Payer: Self-pay

## 2017-03-14 ENCOUNTER — Ambulatory Visit (HOSPITAL_COMMUNITY)
Admission: RE | Admit: 2017-03-14 | Discharge: 2017-03-14 | Disposition: A | Discharge status: Home / Self Care | Payer: Medicare Other | Source: Ambulatory Visit | Attending: Internal Medicine | Admitting: Internal Medicine

## 2017-03-14 DIAGNOSIS — G7000 Myasthenia gravis without (acute) exacerbation: Secondary | ICD-10-CM | POA: Insufficient documentation

## 2017-03-14 DIAGNOSIS — M1990 Unspecified osteoarthritis, unspecified site: Secondary | ICD-10-CM | POA: Insufficient documentation

## 2017-03-14 DIAGNOSIS — I428 Other cardiomyopathies: Secondary | ICD-10-CM | POA: Insufficient documentation

## 2017-03-14 DIAGNOSIS — Z4502 Encounter for adjustment and management of automatic implantable cardiac defibrillator: Secondary | ICD-10-CM | POA: Insufficient documentation

## 2017-03-14 DIAGNOSIS — F329 Major depressive disorder, single episode, unspecified: Secondary | ICD-10-CM | POA: Insufficient documentation

## 2017-03-14 DIAGNOSIS — Z6829 Body mass index (BMI) 29.0-29.9, adult: Secondary | ICD-10-CM | POA: Insufficient documentation

## 2017-03-14 DIAGNOSIS — I447 Left bundle-branch block, unspecified: Secondary | ICD-10-CM | POA: Insufficient documentation

## 2017-03-14 DIAGNOSIS — E669 Obesity, unspecified: Secondary | ICD-10-CM | POA: Diagnosis not present

## 2017-03-14 DIAGNOSIS — I5022 Chronic systolic (congestive) heart failure: Secondary | ICD-10-CM | POA: Insufficient documentation

## 2017-03-14 HISTORY — PX: BIV PACEMAKER GENERATOR CHANGEOUT: EP1198

## 2017-03-14 LAB — CBC
HEMATOCRIT: 43.1 % (ref 39.0–52.0)
HEMOGLOBIN: 14.4 g/dL (ref 13.0–17.0)
MCH: 31.3 pg (ref 26.0–34.0)
MCHC: 33.4 g/dL (ref 30.0–36.0)
MCV: 93.7 fL (ref 78.0–100.0)
Platelets: 153 10*3/uL (ref 150–400)
RBC: 4.6 MIL/uL (ref 4.22–5.81)
RDW: 13.4 % (ref 11.5–15.5)
WBC: 7.3 10*3/uL (ref 4.0–10.5)

## 2017-03-14 LAB — BASIC METABOLIC PANEL
Anion gap: 11 (ref 5–15)
BUN: 19 mg/dL (ref 6–20)
CALCIUM: 8.3 mg/dL — AB (ref 8.9–10.3)
CHLORIDE: 106 mmol/L (ref 101–111)
CO2: 24 mmol/L (ref 22–32)
CREATININE: 1.11 mg/dL (ref 0.61–1.24)
GFR calc Af Amer: >60 mL/min (ref >60)
GFR calc non Af Amer: >60 mL/min (ref >60)
GLUCOSE: 83 mg/dL (ref 65–99)
Potassium: 4.2 mmol/L (ref 3.5–5.1)
Sodium: 141 mmol/L (ref 135–145)

## 2017-03-14 LAB — SURGICAL PCR SCREEN
MRSA, PCR: NEGATIVE
STAPHYLOCOCCUS AUREUS: NEGATIVE

## 2017-03-14 SURGERY — BIV PACEMAKER GENERATOR CHANGEOUT

## 2017-03-14 MED ORDER — CHLORHEXIDINE GLUCONATE 4 % EX LIQD: 60.0000 mL | Freq: Once | CUTANEOUS | Status: DC

## 2017-03-14 MED ORDER — LIDOCAINE HCL 1 % IJ SOLN
INTRAMUSCULAR | Status: AC
  Filled 2017-03-14: qty 60

## 2017-03-14 MED ORDER — FENTANYL CITRATE (PF) 100 MCG/2ML IJ SOLN
INTRAMUSCULAR | Status: AC
  Filled 2017-03-14: qty 2

## 2017-03-14 MED ORDER — SODIUM CHLORIDE 0.9 % IV SOLN
INTRAVENOUS | Status: DC
  Administered 2017-03-14: 13:00:00 via INTRAVENOUS

## 2017-03-14 MED ORDER — LIDOCAINE HCL (PF) 1 % IJ SOLN
INTRAMUSCULAR | Status: DC | PRN
  Administered 2017-03-14: 50 mL

## 2017-03-14 MED ORDER — SODIUM CHLORIDE 0.9% FLUSH: 3.0000 mL | INTRAVENOUS | Status: DC | PRN

## 2017-03-14 MED ORDER — SODIUM CHLORIDE 0.9% FLUSH: 3.0000 mL | Freq: Two times a day (BID) | INTRAVENOUS | Status: DC

## 2017-03-14 MED ORDER — VANCOMYCIN HCL IN DEXTROSE 1-5 GM/200ML-% IV SOLN
INTRAVENOUS | Status: AC
  Filled 2017-03-14: qty 200

## 2017-03-14 MED ORDER — MUPIROCIN 2 % EX OINT
TOPICAL_OINTMENT | Freq: Once | CUTANEOUS | Status: AC
  Administered 2017-03-14: 1 via NASAL

## 2017-03-14 MED ORDER — SODIUM CHLORIDE 0.9 % IR SOLN
Status: AC
  Filled 2017-03-14: qty 2

## 2017-03-14 MED ORDER — MIDAZOLAM HCL 5 MG/5ML IJ SOLN
INTRAMUSCULAR | Status: AC
  Filled 2017-03-14: qty 5

## 2017-03-14 MED ORDER — ACETAMINOPHEN 325 MG PO TABS: 325.0000 mg | ORAL_TABLET | ORAL | Status: DC | PRN

## 2017-03-14 MED ORDER — VANCOMYCIN HCL IN DEXTROSE 1-5 GM/200ML-% IV SOLN
1000.0000 mg | INTRAVENOUS | Status: AC
  Administered 2017-03-14: 1000 mg via INTRAVENOUS

## 2017-03-14 MED ORDER — SODIUM CHLORIDE 0.9 % IR SOLN
80.0000 mg | Status: AC
  Administered 2017-03-14: 80 mg

## 2017-03-14 MED ORDER — ONDANSETRON HCL 4 MG/2ML IJ SOLN: 4.0000 mg | Freq: Four times a day (QID) | INTRAMUSCULAR | Status: DC | PRN

## 2017-03-14 MED ORDER — SODIUM CHLORIDE 0.9 % IV SOLN: 250.0000 mL | INTRAVENOUS | Status: DC | PRN

## 2017-03-14 SURGICAL SUPPLY — 5 items
CABLE SURGICAL S-101-97-12 (CABLE) ×3 IMPLANT
PACEMAKER PRCT MRI CRTP W1TR01 (Pacemaker) ×1 IMPLANT
PAD DEFIB LIFELINK (PAD) ×3 IMPLANT
PPM PRECEPTA MRI CRT-P W1TR01 (Pacemaker) ×3 IMPLANT
TRAY PACEMAKER INSERTION (PACKS) ×3 IMPLANT

## 2017-03-14 NOTE — H&P (Signed)
   CC:  ICD at [facility].  [name] is an 84 y.o. male who presents today for generator change.  Since that time, the patient reports doing reasonably well. He remains active for his age.  Today, he denies symptoms of palpitations, chest pain, shortness of breath,  dizziness, presyncope,or  syncope.  The patient is otherwise without complaint today.  His ICD shock coils have previously fractured.  We have had multiple discussions and have previously turned shock therapies off.  Today, he is clear that he would prefer downgrade to CRT-P rather than lead revision/ ICD.      Past Medical History:  Diagnosis Date  . Chronic systolic congestive heart failure ([place])   . Depression   . DJD (degenerative joint disease)   . Implantable cardioverter-defibrillator lead failure    s/p lead revision in 2010 by Dr [name],  subsequent newly placed RV defibrillator lead has fractured.  ICD therapies programmed off 6/15  . Myasthenia gravis    mild  . Nonischemic cardiomyopathy (HCC)    EF improved from 20% to 50% with CRT  . Obesity   . Pickwickian syndrome ([place])   . Schwannoma    rested at [facility]        Past Surgical History:  Procedure Laterality Date  . BIV ICD GENERTAOR CHANGE OUT Left 05/23/2011   Procedure: BIV ICD GENERTAOR CHANGE OUT;  Surgeon: [name], MD;  Location: [facility] CATH LAB;  Service: Cardiovascular;  Laterality: Left;  . CARDIAC DEFIBRILLATOR PLACEMENT  09/13/05, 12/07/2007, 05/23/11   MDT BiV ICD implanted by Dr [name], His Fidelis [phone] lead fractured and was replaced by Dr [name] 12/07/2007, Generator change by Dr [name] 05/23/11  . schwannoma resection     removed at [place] are reviewed  ROS- all systems reviewed and negative except as per HPI above  Physical Exam: Vitals:   03/14/17 1250  BP: 136/77  Pulse: 75  Temp: 97.8 F (36.6 C)  SpO2: 100%     GEN- The patient is well appearing, alert and oriented x 3 today.     Head- normocephalic, atraumatic Eyes-  Sclera clear, conjunctiva pink Ears- hearing intact Oropharynx- clear Lungs- Clear to ausculation bilaterally, normal work of breathing Chest- ICD pocket is well healed Heart- Regular rate and rhythm, no murmurs, rubs or gallops, PMI not laterally displaced GI- soft, NT, ND, + BS Extremities- no clubbing, cyanosis, +1 edema  Assessment and Plan:  1. Nonischemic CM/ acute on chronic systolic dysfunction Stable No change required today He has responded previously to BiV pacing. Echo 2015 is reviewed again today His ICD shock coils have previously fractured.  We have had multiple discussions and have previously turned shock therapies off.  Today, he is clear that he would prefer downgrade to CRT-P rather than lead revision/ ICD.  The pace/sense portion of his MDT [employer] Quattro [phone] lead appears to be working fine.  He is not device dependant.  I may opt to switch the LV and RV ports Risks, benefits, and alternatives to pulse generator replacement with downgrade to CRT-P were discussed in detail today witih patient and his daughter.  The patient understands that risks include but are not limited to bleeding, infection, pneumothorax, perforation, tamponade, vascular damage, renal failure, MI, stroke, death, damage to his existing leads, and lead dislodgement and wishes to proceed.    [name] MD, [employer] 03/14/2017 12:58 PM

## 2017-03-14 NOTE — Discharge Instructions (Signed)

## 2017-03-17 ENCOUNTER — Encounter (HOSPITAL_COMMUNITY): Payer: Self-pay | Admitting: Internal Medicine

## 2017-03-17 MED FILL — Vancomycin HCl-Dextrose IV Soln 1 GM/200ML-5%: INTRAVENOUS | Qty: 200 | Status: AC

## 2017-03-17 MED FILL — Lidocaine HCl Local Inj 1%: INTRAMUSCULAR | Qty: 60 | Status: AC

## 2017-03-17 MED FILL — Gentamicin Sulfate Inj 40 MG/ML: INTRAMUSCULAR | Qty: 80 | Status: AC

## 2017-03-21 ENCOUNTER — Telehealth: Payer: Self-pay | Admitting: Internal Medicine

## 2017-03-21 NOTE — Telephone Encounter (Signed)
Patient scheduled for Wound check for pacemaker 03-26-17 and wanted to know if he can be worked in 03-27-17 instead?

## 2017-03-21 NOTE — Telephone Encounter (Signed)
Patient is unable to keep DC appt for 2/21. Added him on to Friday at 10 am on 2/22. Patient was very Patent attorney.

## 2017-03-26 ENCOUNTER — Ambulatory Visit: Payer: Medicare Other

## 2017-03-28 ENCOUNTER — Ambulatory Visit (INDEPENDENT_AMBULATORY_CARE_PROVIDER_SITE_OTHER): Payer: Medicare Other | Admitting: *Deleted

## 2017-03-28 DIAGNOSIS — I428 Other cardiomyopathies: Secondary | ICD-10-CM

## 2017-03-28 LAB — CUP PACEART INCLINIC DEVICE CHECK
Battery Voltage: 3.18 V
Brady Statistic RA Percent Paced: 2.17 %
Brady Statistic RV Percent Paced: 96.76 %
Implantable Lead Implant Date: 20070810
Implantable Lead Implant Date: 20091102
Implantable Lead Location: 753858
Implantable Lead Location: 753859
Implantable Lead Model: 4194
Implantable Lead Model: 5076
Implantable Lead Model: 6947
Implantable Pulse Generator Implant Date: 20190208
Lead Channel Impedance Value: 171 Ohm
Lead Channel Impedance Value: 323 Ohm
Lead Channel Impedance Value: 361 Ohm
Lead Channel Impedance Value: 456 Ohm
Lead Channel Pacing Threshold Amplitude: 1.25 V
Lead Channel Pacing Threshold Pulse Width: 0.4 ms
Lead Channel Pacing Threshold Pulse Width: 0.4 ms
Lead Channel Pacing Threshold Pulse Width: 0.8 ms
Lead Channel Sensing Intrinsic Amplitude: 1.875 mV
Lead Channel Sensing Intrinsic Amplitude: 12.625 mV
Lead Channel Sensing Intrinsic Amplitude: 15.75 mV
Lead Channel Sensing Intrinsic Amplitude: 2.875 mV
Lead Channel Setting Pacing Amplitude: 2 V
Lead Channel Setting Pacing Amplitude: 3.25 V
Lead Channel Setting Pacing Pulse Width: 0.4 ms
Lead Channel Setting Sensing Sensitivity: 1.2 mV
MDC IDC LEAD IMPLANT DT: 20070810
MDC IDC LEAD LOCATION: 753860
MDC IDC MSMT BATTERY REMAINING LONGEVITY: 104 mo
MDC IDC MSMT LEADCHNL LV IMPEDANCE VALUE: 285 Ohm
MDC IDC MSMT LEADCHNL LV IMPEDANCE VALUE: 513 Ohm
MDC IDC MSMT LEADCHNL LV IMPEDANCE VALUE: 627 Ohm
MDC IDC MSMT LEADCHNL LV PACING THRESHOLD AMPLITUDE: 1.25 V
MDC IDC MSMT LEADCHNL RA PACING THRESHOLD AMPLITUDE: 0.5 V
MDC IDC MSMT LEADCHNL RV IMPEDANCE VALUE: 646 Ohm
MDC IDC MSMT LEADCHNL RV IMPEDANCE VALUE: 969 Ohm
MDC IDC SESS DTM: 20190222110706
MDC IDC SET LEADCHNL LV PACING PULSEWIDTH: 0.4 ms
MDC IDC SET LEADCHNL RA PACING AMPLITUDE: 1.5 V
MDC IDC STAT BRADY AP VP PERCENT: 1.64 %
MDC IDC STAT BRADY AP VS PERCENT: 0.02 %
MDC IDC STAT BRADY AS VP PERCENT: 95.12 %
MDC IDC STAT BRADY AS VS PERCENT: 3.22 %

## 2017-03-28 NOTE — Progress Notes (Signed)
Wound check appointment. Steri-strips removed. Wound without redness or edema. Incision edges approximated, wound well healed. Normal device function. Thresholds, sensing, and impedances consistent with implant measurements. Noted that P/S of RV lead 6947 is in LV port and LV lead [id number] in RV port. Device programmed at  chronic outputs/ adaptive. Histogram distribution appropriate for patient and level of activity. No mode switches or high ventricular rates noted. Patient educated about wound care, arm mobility. ROV 06/16/17 w/ JA

## 2017-06-16 ENCOUNTER — Encounter (INDEPENDENT_AMBULATORY_CARE_PROVIDER_SITE_OTHER): Payer: Self-pay

## 2017-06-16 ENCOUNTER — Encounter: Payer: Self-pay | Admitting: Internal Medicine

## 2017-06-16 ENCOUNTER — Ambulatory Visit (INDEPENDENT_AMBULATORY_CARE_PROVIDER_SITE_OTHER): Payer: Medicare Other | Admitting: Internal Medicine

## 2017-06-16 VITALS — BP 120/62 | HR 69 | Ht 72.0 in | Wt 228.0 lb

## 2017-06-16 DIAGNOSIS — I428 Other cardiomyopathies: Secondary | ICD-10-CM | POA: Diagnosis not present

## 2017-06-16 DIAGNOSIS — I5022 Chronic systolic (congestive) heart failure: Secondary | ICD-10-CM | POA: Diagnosis not present

## 2017-06-16 DIAGNOSIS — Z950 Presence of cardiac pacemaker: Secondary | ICD-10-CM | POA: Diagnosis not present

## 2017-06-16 DIAGNOSIS — G7000 Myasthenia gravis without (acute) exacerbation: Secondary | ICD-10-CM | POA: Insufficient documentation

## 2017-06-16 LAB — CUP PACEART INCLINIC DEVICE CHECK
Brady Statistic AP VP Percent: 2.52 %
Brady Statistic AP VS Percent: 0.02 %
Brady Statistic RV Percent Paced: 94.39 %
Implantable Lead Implant Date: 20070810
Implantable Lead Location: 753858
Implantable Lead Location: 753859
Implantable Lead Model: 4194
Lead Channel Impedance Value: 190 Ohm
Lead Channel Impedance Value: 323 Ohm
Lead Channel Impedance Value: 361 Ohm
Lead Channel Impedance Value: 532 Ohm
Lead Channel Impedance Value: 950 Ohm
Lead Channel Pacing Threshold Amplitude: 1.25 V
Lead Channel Pacing Threshold Pulse Width: 0.4 ms
Lead Channel Pacing Threshold Pulse Width: 0.4 ms
Lead Channel Sensing Intrinsic Amplitude: 14.25 mV
Lead Channel Sensing Intrinsic Amplitude: 14.375 mV
Lead Channel Sensing Intrinsic Amplitude: 2.5 mV
Lead Channel Sensing Intrinsic Amplitude: 2.875 mV
Lead Channel Setting Pacing Amplitude: 2 V
Lead Channel Setting Pacing Pulse Width: 0.4 ms
Lead Channel Setting Sensing Sensitivity: 1.2 mV
MDC IDC LEAD IMPLANT DT: 20070810
MDC IDC LEAD IMPLANT DT: 20091102
MDC IDC LEAD LOCATION: 753860
MDC IDC MSMT BATTERY REMAINING LONGEVITY: 102 mo
MDC IDC MSMT BATTERY VOLTAGE: 3.11 V
MDC IDC MSMT LEADCHNL LV IMPEDANCE VALUE: 285 Ohm
MDC IDC MSMT LEADCHNL LV IMPEDANCE VALUE: 627 Ohm
MDC IDC MSMT LEADCHNL LV PACING THRESHOLD PULSEWIDTH: 0.4 ms
MDC IDC MSMT LEADCHNL RA IMPEDANCE VALUE: 418 Ohm
MDC IDC MSMT LEADCHNL RA PACING THRESHOLD AMPLITUDE: 0.75 V
MDC IDC MSMT LEADCHNL RV IMPEDANCE VALUE: 646 Ohm
MDC IDC MSMT LEADCHNL RV PACING THRESHOLD AMPLITUDE: 1.25 V
MDC IDC PG IMPLANT DT: 20190208
MDC IDC SESS DTM: 20190513170411
MDC IDC SET LEADCHNL LV PACING AMPLITUDE: 3.25 V
MDC IDC SET LEADCHNL LV PACING PULSEWIDTH: 0.4 ms
MDC IDC SET LEADCHNL RA PACING AMPLITUDE: 1.5 V
MDC IDC STAT BRADY AS VP PERCENT: 91.87 %
MDC IDC STAT BRADY AS VS PERCENT: 5.59 %
MDC IDC STAT BRADY RA PERCENT PACED: 3.45 %

## 2017-06-16 NOTE — Patient Instructions (Addendum)
Medication Instructions:  Your physician recommends that you continue on your current medications as directed. Please refer to the Current Medication list given to you today.  Labwork: None ordered.  Testing/Procedures: None ordered.  Follow-Up: Your physician wants you to follow-up in: one year with Chanetta Marshall, NP.   You will receive a reminder letter in the mail two months in advance. If you don't receive a letter, please call our office to schedule the follow-up appointment.  Remote monitoring is used to monitor your Pacemaker from home. This monitoring reduces the number of office visits required to check your device to one time per year. It allows [place] to keep an eye on the functioning of your device to ensure it is working properly. You are scheduled for a device check from home on 09/15/2017. You may send your transmission at any time that day. If you have a wireless device, the transmission will be sent automatically. After your physician reviews your transmission, you will receive a postcard with your next transmission date.  Any Other Special Instructions Will Be Listed Below (If Applicable).  If you need a refill on your cardiac medications before your next appointment, please call your pharmacy.

## 2017-06-16 NOTE — Progress Notes (Signed)
PCP: [name], MD Primary Cardiologist: Dr [name] Primary EP:  [name]  [name] is a 84 y.o. male who presents today for routine electrophysiology followup.  Since his recent BiV PPM generator change, the patient reports doing very well.  He has noticed some age related unsteadiness.  Today, he denies symptoms of palpitations, chest pain, shortness of breath,  lower extremity edema, dizziness, presyncope, or syncope.  The patient is otherwise without complaint today.   Past Medical History:  Diagnosis Date  . Chronic systolic congestive heart failure ([place])   . Depression   . DJD (degenerative joint disease)   . Implantable cardioverter-defibrillator lead failure    s/p lead revision in 2010 by Dr [name],  subsequent newly placed RV defibrillator lead has fractured.  ICD therapies programmed off 6/15  . Myasthenia gravis    mild  . Nonischemic cardiomyopathy (HCC)    EF improved from 20% to 50% with CRT  . Obesity   . Pickwickian syndrome ([place])   . Schwannoma    rested at [facility]   Past Surgical History:  Procedure Laterality Date  . BIV ICD GENERTAOR CHANGE OUT Left 05/23/2011   Procedure: BIV ICD GENERTAOR CHANGE OUT;  Surgeon: [name], MD;  Location: [facility] CATH LAB;  Service: Cardiovascular;  Laterality: Left;  . BIV PACEMAKER GENERATOR CHANGEOUT N/A 03/14/2017   Procedure: BIV PACEMAKER GENERATOR CHANGEOUT- downgrade from [place] ICD;  Surgeon: [name], MD;  Location: [place] CV LAB;  Service: Cardiovascular;  Laterality: N/A;  . CARDIAC DEFIBRILLATOR PLACEMENT  09/13/05, 12/07/2007, 05/23/11   MDT BiV ICD implanted by Dr [name], His Fidelis [phone] lead fractured and was replaced by Dr [name] 12/07/2007, Generator change by Dr [name] 05/23/11  . schwannoma resection     removed at [place]- all systems are reviewed and negative except as per HPI above  Current Outpatient Medications  Medication Sig Dispense Refill  . aspirin 325 MG tablet Take 325 mg  by mouth daily.    . carvedilol (COREG) 6.25 MG tablet TAKE 1 TABLET BY MOUTH TWICE DAILY 180 tablet 3  . Cholecalciferol (VITAMIN D3) 2000 UNITS TABS Take 1 tablet by mouth daily.    . cyanocobalamin 100 MCG tablet Take 100 mcg by mouth daily.    . cycloSPORINE modified (GENGRAF) 25 MG capsule Take 25 mg by mouth 2 (two) times daily.    Marland Kitchen docusate sodium (COLACE) 100 MG capsule Take 100 mg by mouth daily as needed for mild constipation.    Marland Kitchen losartan (COZAAR) 25 MG tablet TAKE 1 TABLET BY MOUTH ONCE DAILY 90 tablet 3  . Multiple Vitamins-Minerals (MENS MULTI VITAMIN & MINERAL PO) Take 1 tablet by mouth daily.     No current facility-administered medications for this visit.     Physical Exam: Vitals:   06/16/17 1359  BP: 120/62  Pulse: 69  Weight: 228 lb (103.4 kg)  Height: 6' (1.829 m)    GEN- The patient is well appearing, alert and oriented x 3 today.   Head- normocephalic, atraumatic Eyes-  Sclera clear, conjunctiva pink Ears- hearing intact Oropharynx- clear Lungs- Clear to ausculation bilaterally, normal work of breathing Chest- pacemaker pocket is well healed Heart- Regular rate and rhythm, no murmurs, rubs or gallops, PMI not laterally displaced GI- soft, NT, ND, + BS Extremities- no clubbing, cyanosis, or edema  Pacemaker interrogation- reviewed in detail today,  See PACEART report  ekg tracing ordered today is personally reviewed and shows  sinus with V pacing and PVCs  Assessment and Plan:  1. Chronic systolic dysfunction/ nonischemic CM Normal pacemaker function LV lead in RV port, RV lead in LV port at gen change due to RV lead failure previously. See Pace Art report No changes today  2. Obesity Body mass index is 30.92 kg/m.  [facility] Return to see EP PA in a year  [name] MD, [facility] 06/16/2017 2:12 PM

## 2017-09-15 ENCOUNTER — Ambulatory Visit (INDEPENDENT_AMBULATORY_CARE_PROVIDER_SITE_OTHER): Payer: Medicare Other | Admitting: *Deleted

## 2017-09-15 DIAGNOSIS — I428 Other cardiomyopathies: Secondary | ICD-10-CM | POA: Diagnosis not present

## 2017-09-15 NOTE — Progress Notes (Signed)
Remote pacemaker transmission.   

## 2017-09-16 ENCOUNTER — Encounter: Payer: Self-pay | Admitting: Cardiology

## 2017-10-08 LAB — CUP PACEART REMOTE DEVICE CHECK
Battery Remaining Longevity: 90 mo
Brady Statistic AP VP Percent: 1.3 %
Brady Statistic AS VS Percent: 2.41 %
Brady Statistic RV Percent Paced: 97.56 %
Date Time Interrogation Session: 20190812055415
Implantable Lead Implant Date: 20070810
Implantable Lead Location: 753860
Implantable Lead Model: 6947
Implantable Pulse Generator Implant Date: 20190208
Lead Channel Impedance Value: 190 Ohm
Lead Channel Impedance Value: 418 Ohm
Lead Channel Impedance Value: 646 Ohm
Lead Channel Pacing Threshold Amplitude: 1.25 V
Lead Channel Pacing Threshold Amplitude: 1.375 V
Lead Channel Pacing Threshold Pulse Width: 0.4 ms
Lead Channel Sensing Intrinsic Amplitude: 13.125 mV
Lead Channel Sensing Intrinsic Amplitude: 13.125 mV
Lead Channel Setting Pacing Amplitude: 2 V
Lead Channel Setting Pacing Pulse Width: 0.4 ms
Lead Channel Setting Sensing Sensitivity: 1.2 mV
MDC IDC LEAD IMPLANT DT: 20070810
MDC IDC LEAD IMPLANT DT: 20091102
MDC IDC LEAD LOCATION: 753858
MDC IDC LEAD LOCATION: 753859
MDC IDC MSMT BATTERY VOLTAGE: 3.01 V
MDC IDC MSMT LEADCHNL LV IMPEDANCE VALUE: 304 Ohm
MDC IDC MSMT LEADCHNL LV IMPEDANCE VALUE: 323 Ohm
MDC IDC MSMT LEADCHNL LV IMPEDANCE VALUE: 513 Ohm
MDC IDC MSMT LEADCHNL LV IMPEDANCE VALUE: 646 Ohm
MDC IDC MSMT LEADCHNL RA IMPEDANCE VALUE: 361 Ohm
MDC IDC MSMT LEADCHNL RA PACING THRESHOLD AMPLITUDE: 0.625 V
MDC IDC MSMT LEADCHNL RA PACING THRESHOLD PULSEWIDTH: 0.4 ms
MDC IDC MSMT LEADCHNL RA SENSING INTR AMPL: 2 mV
MDC IDC MSMT LEADCHNL RA SENSING INTR AMPL: 2 mV
MDC IDC MSMT LEADCHNL RV IMPEDANCE VALUE: 950 Ohm
MDC IDC MSMT LEADCHNL RV PACING THRESHOLD PULSEWIDTH: 0.4 ms
MDC IDC SET LEADCHNL LV PACING AMPLITUDE: 3.5 V
MDC IDC SET LEADCHNL RA PACING AMPLITUDE: 1.5 V
MDC IDC SET LEADCHNL RV PACING PULSEWIDTH: 0.4 ms
MDC IDC STAT BRADY AP VS PERCENT: 0.02 %
MDC IDC STAT BRADY AS VP PERCENT: 96.27 %
MDC IDC STAT BRADY RA PERCENT PACED: 1.68 %

## 2017-12-15 ENCOUNTER — Ambulatory Visit (INDEPENDENT_AMBULATORY_CARE_PROVIDER_SITE_OTHER): Payer: Medicare Other | Admitting: *Deleted

## 2017-12-15 DIAGNOSIS — I5022 Chronic systolic (congestive) heart failure: Secondary | ICD-10-CM

## 2017-12-15 DIAGNOSIS — I428 Other cardiomyopathies: Secondary | ICD-10-CM

## 2017-12-15 NOTE — Progress Notes (Signed)
Remote pacemaker transmission.   

## 2017-12-22 ENCOUNTER — Other Ambulatory Visit: Payer: Self-pay | Admitting: Cardiology

## 2018-01-20 ENCOUNTER — Other Ambulatory Visit: Payer: Self-pay | Admitting: Cardiology

## 2018-02-05 LAB — CUP PACEART REMOTE DEVICE CHECK
Battery Remaining Longevity: 89 mo
Battery Voltage: 2.97 V
Brady Statistic AP VP Percent: 1.23 %
Brady Statistic AS VS Percent: 3.53 %
Date Time Interrogation Session: 20191111055043
Implantable Lead Implant Date: 20070810
Implantable Lead Implant Date: 20070810
Implantable Lead Location: 753860
Implantable Lead Model: 4194
Implantable Lead Model: 6947
Implantable Pulse Generator Implant Date: 20190208
Lead Channel Impedance Value: 380 Ohm
Lead Channel Impedance Value: 437 Ohm
Lead Channel Impedance Value: 684 Ohm
Lead Channel Pacing Threshold Amplitude: 1.25 V
Lead Channel Pacing Threshold Amplitude: 1.5 V
Lead Channel Pacing Threshold Pulse Width: 0.4 ms
Lead Channel Pacing Threshold Pulse Width: 0.4 ms
Lead Channel Sensing Intrinsic Amplitude: 12.875 mV
Lead Channel Sensing Intrinsic Amplitude: 12.875 mV
Lead Channel Setting Pacing Amplitude: 1.5 V
Lead Channel Setting Pacing Amplitude: 3.5 V
Lead Channel Setting Pacing Pulse Width: 0.4 ms
Lead Channel Setting Sensing Sensitivity: 1.2 mV
MDC IDC LEAD IMPLANT DT: 20091102
MDC IDC LEAD LOCATION: 753858
MDC IDC LEAD LOCATION: 753859
MDC IDC MSMT LEADCHNL LV IMPEDANCE VALUE: 190 Ohm
MDC IDC MSMT LEADCHNL LV IMPEDANCE VALUE: 323 Ohm
MDC IDC MSMT LEADCHNL LV IMPEDANCE VALUE: 342 Ohm
MDC IDC MSMT LEADCHNL LV IMPEDANCE VALUE: 589 Ohm
MDC IDC MSMT LEADCHNL LV IMPEDANCE VALUE: 703 Ohm
MDC IDC MSMT LEADCHNL RA PACING THRESHOLD AMPLITUDE: 0.625 V
MDC IDC MSMT LEADCHNL RA PACING THRESHOLD PULSEWIDTH: 0.4 ms
MDC IDC MSMT LEADCHNL RA SENSING INTR AMPL: 2.375 mV
MDC IDC MSMT LEADCHNL RA SENSING INTR AMPL: 2.375 mV
MDC IDC MSMT LEADCHNL RV IMPEDANCE VALUE: 1026 Ohm
MDC IDC SET LEADCHNL RV PACING AMPLITUDE: 2 V
MDC IDC SET LEADCHNL RV PACING PULSEWIDTH: 0.4 ms
MDC IDC STAT BRADY AP VS PERCENT: 0.02 %
MDC IDC STAT BRADY AS VP PERCENT: 95.22 %
MDC IDC STAT BRADY RA PERCENT PACED: 1.65 %
MDC IDC STAT BRADY RV PERCENT PACED: 96.45 %

## 2018-02-23 ENCOUNTER — Ambulatory Visit (INDEPENDENT_AMBULATORY_CARE_PROVIDER_SITE_OTHER): Payer: Medicare Other | Admitting: Cardiology

## 2018-02-23 ENCOUNTER — Encounter: Payer: Self-pay | Admitting: Cardiology

## 2018-02-23 VITALS — BP 128/78 | HR 53 | Ht 72.0 in | Wt 228.0 lb

## 2018-02-23 DIAGNOSIS — I428 Other cardiomyopathies: Secondary | ICD-10-CM

## 2018-02-23 DIAGNOSIS — I5022 Chronic systolic (congestive) heart failure: Secondary | ICD-10-CM | POA: Diagnosis not present

## 2018-02-23 DIAGNOSIS — I493 Ventricular premature depolarization: Secondary | ICD-10-CM | POA: Diagnosis not present

## 2018-02-23 DIAGNOSIS — Z95810 Presence of automatic (implantable) cardiac defibrillator: Secondary | ICD-10-CM | POA: Diagnosis not present

## 2018-02-23 MED ORDER — LOSARTAN POTASSIUM 25 MG PO TABS: 25.0000 mg | ORAL_TABLET | Freq: Every day | ORAL | 3 refills | Status: AC

## 2018-02-23 MED ORDER — CARVEDILOL 12.5 MG PO TABS: 12.5000 mg | ORAL_TABLET | Freq: Two times a day (BID) | ORAL | 3 refills | Status: AC

## 2018-02-23 MED ORDER — ASPIRIN 81 MG PO TABS: 81.0000 mg | ORAL_TABLET | Freq: Every day | ORAL | Status: DC

## 2018-02-23 NOTE — Progress Notes (Signed)
Cardiology Office Note:    Date:  02/23/2018   ID:  [name], DOB Nov 29, 1933, MRN [id number]  PCP:  [name], MD  Cardiologist:  [name], MD  Electrophysiologist:  None   Referring MD: [name], MD     History of Present Illness:    [place] A Hem is a 85 y.o. male here for follow-up of nonischemic cardiomyopathy with CRT pacing change out 03/14/2017, Dr. Rayann Heman with EF 35 to 40% up from 45.  Medtronic.  He is not device dependent.  Overactive bladder main issue.  Overall doing well.  Feels an occasional voltage through his chest after upgrade.  He sounded quite irregular on auscultation, EKG was performed today which showed frequent PVCs.  I increased his carvedilol.  Denies any fevers chills nausea vomiting syncope bleeding.  Past Medical History:  Diagnosis Date  . Chronic systolic congestive heart failure ([name])   . Depression   . DJD (degenerative joint disease)   . Implantable cardioverter-defibrillator lead failure    s/p lead revision in 2010 by Dr [name],  subsequent newly placed RV defibrillator lead has fractured.  ICD therapies programmed off 6/15  . Myasthenia gravis    mild  . Nonischemic cardiomyopathy (HCC)    EF improved from 20% to 50% with CRT  . Obesity   . Pickwickian syndrome ([place])   . Schwannoma    rested at [facility]    Past Surgical History:  Procedure Laterality Date  . BIV ICD GENERTAOR CHANGE OUT Left 05/23/2011   Procedure: BIV ICD GENERTAOR CHANGE OUT;  Surgeon: [name], MD;  Location: [facility] CATH LAB;  Service: Cardiovascular;  Laterality: Left;  . BIV PACEMAKER GENERATOR CHANGEOUT N/A 03/14/2017   Medtronic Percepta CRT-P MRI [place] model [id number] (SN [id number]) BiV pacemaker.  OF NOTE:  Due to partial failure of the MDT [phone] lead (RV shock/SVC shock coil fracture), I elected to place the pace/ sense portion of this lead into the LV port of the device.  The MDT 1191 was placed into the RV port on the device.  This  will allow LV only pacing should the RV lead fail further.  The patient was   . CARDIAC DEFIBRILLATOR PLACEMENT  09/13/05, 12/07/2007, 05/23/11   MDT BiV ICD implanted by Dr [name], His Fidelis [phone] lead fractured and was replaced by Dr [name] 12/07/2007, Generator change by Dr [name] 05/23/11  . schwannoma resection     removed at [facility]    Current Medications: Current Meds  Medication Sig  . aspirin 81 MG tablet Take 1 tablet (81 mg total) by mouth daily.  . carvedilol (COREG) 12.5 MG tablet Take 1 tablet (12.5 mg total) by mouth 2 (two) times daily.  . Cholecalciferol (VITAMIN D3) 2000 UNITS TABS Take 1 tablet by mouth daily.  . cyanocobalamin 100 MCG tablet Take 100 mcg by mouth daily.  . cycloSPORINE modified (GENGRAF) 25 MG capsule Take 25 mg by mouth 2 (two) times daily.  Marland Kitchen docusate sodium (COLACE) 100 MG capsule Take 100 mg by mouth daily as needed for mild constipation.  Marland Kitchen losartan (COZAAR) 25 MG tablet Take 1 tablet (25 mg total) by mouth daily.  . Multiple Vitamins-Minerals (MENS MULTI VITAMIN & MINERAL PO) Take 1 tablet by mouth daily.  . [DISCONTINUED] aspirin 325 MG tablet Take 325 mg by mouth daily.  . [DISCONTINUED] carvedilol (COREG) 6.25 MG tablet TAKE 1 TABLET BY MOUTH TWICE DAILY  . [DISCONTINUED] losartan (COZAAR) 25 MG tablet Take 1 tablet (25 mg  total) by mouth daily. Patient needs to call and schedule an appointment for further refills 2nd attempt     Allergies:   Penicillins   Social History   Socioeconomic History  . Marital status: Divorced    Spouse name: Not on file  . Number of children: Not on file  . Years of education: Not on file  . Highest education level: Not on file  Occupational History  . Not on file  Social Needs  . Financial resource strain: Not on file  . Food insecurity:    Worry: Not on file    Inability: Not on file  . Transportation needs:    Medical: Not on file    Non-medical: Not on file  Tobacco Use  . Smoking status: Never  Smoker  . Smokeless tobacco: Never Used  Substance and Sexual Activity  . Alcohol use: No  . Drug use: No  . Sexual activity: Not on file  Lifestyle  . Physical activity:    Days per week: Not on file    Minutes per session: Not on file  . Stress: Not on file  Relationships  . Social connections:    Talks on phone: Not on file    Gets together: Not on file    Attends religious service: Not on file    Active member of club or organization: Not on file    Attends meetings of clubs or organizations: Not on file    Relationship status: Not on file  Other Topics Concern  . Not on file  Social History Narrative  . Not on file     Family History: The patient's family history includes Lung disease in his father. There is no history of Heart attack or Stroke.  ROS:   Please see the history of present illness.     All other systems reviewed and are negative.  EKGs/Labs/Other Studies Reviewed:    The following studies were reviewed today: EKG prior office notes, lab work  EKG:  EKG is  ordered today.  The ekg ordered today demonstrates demand pacer, ventricular pacing spikes, P waves preceding ventricular pacing spikes.  Couplet, PVCs noted as well.  Recent Labs: 03/14/2017: BUN 19; Creatinine, Ser 1.11; Hemoglobin 14.4; Platelets 153; Potassium 4.2; Sodium 141  Recent Lipid Panel No results found for: CHOL, TRIG, HDL, CHOLHDL, VLDL, LDLCALC, LDLDIRECT  Physical Exam:    VS:  BP 128/78   Pulse (!) 53   Ht 6' (1.829 m)   Wt 228 lb (103.4 kg)   SpO2 98%   BMI 30.92 kg/m     Wt Readings from Last 3 Encounters:  02/23/18 228 lb (103.4 kg)  06/16/17 228 lb (103.4 kg)  03/14/17 220 lb (99.8 kg)     GEN:  Well nourished, well developed in no acute distress HEENT: Normal NECK: No JVD; No carotid bruits LYMPHATICS: No lymphadenopathy CARDIAC: Irreg,  no murmurs, rubs, gallops RESPIRATORY:  Clear to auscultation without rales, wheezing or rhonchi  ABDOMEN: Soft, non-tender,  non-distended MUSCULOSKELETAL:  No edema; No deformity  SKIN: Warm and dry NEUROLOGIC:  Alert and oriented x 3 PSYCHIATRIC:  Normal affect   ASSESSMENT:    1. NICM (nonischemic cardiomyopathy) ([place])   2. Biventricular ICD (implantable cardioverter-defibrillator) in place   3. Chronic systolic heart failure ([place])   4. PVC (premature ventricular contraction)    PLAN:    In order of problems listed above:  Nonischemic cardiomyopathy - EF approximately 40%.  Lasix 40 mg as needed.  His carvedilol was decreased to 6.25 mg previously.  I will go ahead and increase this back up to 12.5 mg twice a day, this will hopefully help with his PVCs noted on ECG.  Decreasing aspirin to 81 mg.  If he begins to feel dizzy, we may need to pull back the carvedilol once again, or consider discontinuation of angiotensin receptor blocker losartan.  CR T-P - No longer ICD function.  Dr. Rayann Heman. -He did have frequent PVCs noted on EKG as well as couplets.  Not atrial fibrillation.  P waves were clearly seen preceding QRS complexes.  Chronic systolic heart failure -Currently well compensated.  Urinary continence a major issue.  Obesity - Continue to encourage weight loss.   Medication Adjustments/Labs and Tests Ordered: Current medicines are reviewed at length with the patient today.  Concerns regarding medicines are outlined above.  Orders Placed This Encounter  Procedures  . EKG 12-Lead   Meds ordered this encounter  Medications  . carvedilol (COREG) 12.5 MG tablet    Sig: Take 1 tablet (12.5 mg total) by mouth 2 (two) times daily.    Dispense:  180 tablet    Refill:  3  . aspirin 81 MG tablet    Sig: Take 1 tablet (81 mg total) by mouth daily.  Marland Kitchen losartan (COZAAR) 25 MG tablet    Sig: Take 1 tablet (25 mg total) by mouth daily.    Dispense:  90 tablet    Refill:  3    Patient Instructions  Medication Instructions:  Please increase your Carvedilol to 12.5 mg twice daily. Decrease your  Asprin to 81 mg a day. Continue all other medications as listed.  If you need a refill on your cardiac medications before your next appointment, please call your pharmacy.   Follow-Up: Follow up in 6 months with Dr. [name].  You will receive a letter in the mail 2 months before you are due.  Please call us when you receive this letter to schedule your follow up appointment.  Thank you for choosing [facility]!!        Signed, [name], MD  02/23/2018 11:53 AM    [place]

## 2018-02-23 NOTE — Patient Instructions (Signed)
Medication Instructions:  Please increase your Carvedilol to 12.5 mg twice daily. Decrease your Asprin to 81 mg a day. Continue all other medications as listed.  If you need a refill on your cardiac medications before your next appointment, please call your pharmacy.   Follow-Up: Follow up in 6 months with Dr. [name].  You will receive a letter in the mail 2 months before you are due.  Please call us when you receive this letter to schedule your follow up appointment.  Thank you for choosing [facility]!!

## 2018-03-16 ENCOUNTER — Ambulatory Visit (INDEPENDENT_AMBULATORY_CARE_PROVIDER_SITE_OTHER): Payer: Medicare Other

## 2018-03-16 DIAGNOSIS — I5022 Chronic systolic (congestive) heart failure: Secondary | ICD-10-CM

## 2018-03-16 DIAGNOSIS — I428 Other cardiomyopathies: Secondary | ICD-10-CM | POA: Diagnosis not present

## 2018-03-16 LAB — CUP PACEART REMOTE DEVICE CHECK
Brady Statistic AP VP Percent: 1.2 %
Brady Statistic AP VS Percent: 0.02 %
Brady Statistic AS VP Percent: 91.09 %
Brady Statistic RA Percent Paced: 1.64 %
Brady Statistic RV Percent Paced: 92.28 %
Implantable Lead Implant Date: 20070810
Implantable Lead Implant Date: 20070810
Implantable Lead Location: 753859
Implantable Lead Location: 753860
Implantable Pulse Generator Implant Date: 20190208
Lead Channel Impedance Value: 209 Ohm
Lead Channel Impedance Value: 323 Ohm
Lead Channel Impedance Value: 380 Ohm
Lead Channel Impedance Value: 437 Ohm
Lead Channel Impedance Value: 684 Ohm
Lead Channel Impedance Value: 722 Ohm
Lead Channel Pacing Threshold Amplitude: 0.5 V
Lead Channel Pacing Threshold Amplitude: 1.125 V
Lead Channel Pacing Threshold Pulse Width: 0.4 ms
Lead Channel Pacing Threshold Pulse Width: 0.4 ms
Lead Channel Sensing Intrinsic Amplitude: 2.625 mV
Lead Channel Sensing Intrinsic Amplitude: 2.625 mV
Lead Channel Setting Pacing Amplitude: 1.5 V
Lead Channel Setting Sensing Sensitivity: 1.2 mV
MDC IDC LEAD IMPLANT DT: 20091102
MDC IDC LEAD LOCATION: 753858
MDC IDC MSMT BATTERY REMAINING LONGEVITY: 81 mo
MDC IDC MSMT BATTERY VOLTAGE: 2.94 V
MDC IDC MSMT LEADCHNL LV IMPEDANCE VALUE: 342 Ohm
MDC IDC MSMT LEADCHNL LV IMPEDANCE VALUE: 589 Ohm
MDC IDC MSMT LEADCHNL LV PACING THRESHOLD AMPLITUDE: 1.625 V
MDC IDC MSMT LEADCHNL RA PACING THRESHOLD PULSEWIDTH: 0.4 ms
MDC IDC MSMT LEADCHNL RV IMPEDANCE VALUE: 1026 Ohm
MDC IDC MSMT LEADCHNL RV SENSING INTR AMPL: 16.625 mV
MDC IDC MSMT LEADCHNL RV SENSING INTR AMPL: 16.625 mV
MDC IDC SESS DTM: 20200210062609
MDC IDC SET LEADCHNL LV PACING AMPLITUDE: 3.75 V
MDC IDC SET LEADCHNL LV PACING PULSEWIDTH: 0.4 ms
MDC IDC SET LEADCHNL RV PACING AMPLITUDE: 2 V
MDC IDC SET LEADCHNL RV PACING PULSEWIDTH: 0.4 ms
MDC IDC STAT BRADY AS VS PERCENT: 7.69 %

## 2018-03-26 NOTE — Progress Notes (Signed)
Remote pacemaker transmission.   

## 2018-06-11 ENCOUNTER — Telehealth: Payer: Self-pay | Admitting: Cardiology

## 2018-06-11 NOTE — Telephone Encounter (Signed)
New Message:    Patient calling to inform the doctor that he is going to be tested for the virus. Patient states Dr. Talbot Grumbling from [place] requested that patient call the office let the doctor know. Please call patient if any questions.

## 2018-06-11 NOTE — Telephone Encounter (Signed)
Will forward to Dr Skains for his knowledge.  °

## 2018-06-12 NOTE — Telephone Encounter (Signed)
Thanks for the info [name], MD

## 2018-06-15 ENCOUNTER — Ambulatory Visit (INDEPENDENT_AMBULATORY_CARE_PROVIDER_SITE_OTHER): Payer: Medicare Other | Admitting: *Deleted

## 2018-06-15 ENCOUNTER — Other Ambulatory Visit: Payer: Self-pay

## 2018-06-15 DIAGNOSIS — I5022 Chronic systolic (congestive) heart failure: Secondary | ICD-10-CM

## 2018-06-15 DIAGNOSIS — I428 Other cardiomyopathies: Secondary | ICD-10-CM

## 2018-06-16 LAB — CUP PACEART REMOTE DEVICE CHECK
Battery Remaining Longevity: 65 mo
Battery Voltage: 2.92 V
Brady Statistic AP VP Percent: 1.44 %
Brady Statistic AP VS Percent: 0.02 %
Brady Statistic AS VP Percent: 93.3 %
Brady Statistic AS VS Percent: 5.23 %
Brady Statistic RA Percent Paced: 1.93 %
Brady Statistic RV Percent Paced: 94.74 %
Date Time Interrogation Session: 20200511050152
Implantable Lead Implant Date: 20070810
Implantable Lead Implant Date: 20070810
Implantable Lead Implant Date: 20091102
Implantable Lead Location: 753858
Implantable Lead Location: 753859
Implantable Lead Location: 753860
Implantable Lead Model: 4194
Implantable Lead Model: 5076
Implantable Lead Model: 6947
Implantable Pulse Generator Implant Date: 20190208
Lead Channel Impedance Value: 1026 Ohm
Lead Channel Impedance Value: 209 Ohm
Lead Channel Impedance Value: 323 Ohm
Lead Channel Impedance Value: 323 Ohm
Lead Channel Impedance Value: 380 Ohm
Lead Channel Impedance Value: 437 Ohm
Lead Channel Impedance Value: 570 Ohm
Lead Channel Impedance Value: 684 Ohm
Lead Channel Impedance Value: 703 Ohm
Lead Channel Pacing Threshold Amplitude: 0.75 V
Lead Channel Pacing Threshold Amplitude: 1.5 V
Lead Channel Pacing Threshold Amplitude: 1.625 V
Lead Channel Pacing Threshold Pulse Width: 0.4 ms
Lead Channel Pacing Threshold Pulse Width: 0.4 ms
Lead Channel Pacing Threshold Pulse Width: 0.4 ms
Lead Channel Sensing Intrinsic Amplitude: 11.75 mV
Lead Channel Sensing Intrinsic Amplitude: 11.75 mV
Lead Channel Sensing Intrinsic Amplitude: 2.5 mV
Lead Channel Sensing Intrinsic Amplitude: 2.5 mV
Lead Channel Setting Pacing Amplitude: 1.5 V
Lead Channel Setting Pacing Amplitude: 2.25 V
Lead Channel Setting Pacing Amplitude: 3.75 V
Lead Channel Setting Pacing Pulse Width: 0.4 ms
Lead Channel Setting Pacing Pulse Width: 0.4 ms
Lead Channel Setting Sensing Sensitivity: 1.2 mV

## 2018-06-22 NOTE — Progress Notes (Signed)
Remote pacemaker transmission.   

## 2018-07-21 ENCOUNTER — Telehealth: Payer: Self-pay | Admitting: *Deleted

## 2018-07-21 NOTE — Telephone Encounter (Signed)

## 2018-07-21 NOTE — Progress Notes (Signed)
Virtual Visit via Telephone Note   This visit type was conducted due to national recommendations for restrictions regarding the COVID-19 Pandemic (e.g. social distancing) in an effort to limit this patient's exposure and mitigate transmission in our community.  Due to his co-morbid illnesses, this patient is at least at moderate risk for complications without adequate follow up.  This format is felt to be most appropriate for this patient at this time.  The patient did not have access to video technology/had technical difficulties with video requiring transitioning to audio format only (telephone).  All issues noted in this document were discussed and addressed.  No physical exam could be performed with this format.  Please refer to the patient's chart for his  consent to telehealth for [facility].   Date:  07/22/2018   ID:  [name], DOB 15-May-1933, MRN [id number]  Patient Location: Home Provider Location: Office  PCP:  [name], MD  Cardiologist:  [name], MD  Electrophysiologist:  None   Evaluation Performed:  Follow-Up Visit  Chief Complaint:  CHF  History of Present Illness:    [name] is a 85 y.o. male with nonischemic cardiomyopathy with CRT pacing change out 03/14/2017 from BIV ICD to BiV only.  Dr. Rayann Heman.  With EF 35 to 40% up from 20.  Medtronic.  He is not device dependent.  Overactive bladder main issue.  Overall doing well.  Feels an occasional voltage through his chest after upgrade.  He sounded quite irregular on auscultation, EKG was performed today which showed frequent PVCs.  I increased his carvedilol. if BP lower then may need to decrease losartan.    Today he tells me he was tested in May for Stigler and it was neg.  He has been staying in, and family, friends bring food and mail.  He has been off balance and did get a walker and has done better.  No chest pain or SOB.  No colds of fevers.  Some ankle pain but saw PCP and was  taken care of.  If he were to walk long distance he may be SOB.  BP is good on higher dose of coreg.    The patient does not have symptoms concerning for COVID-19 infection (fever, chills, cough, or new shortness of breath).    Past Medical History:  Diagnosis Date  . Chronic systolic congestive heart failure ([place])   . Depression   . DJD (degenerative joint disease)   . Implantable cardioverter-defibrillator lead failure    s/p lead revision in 2010 by Dr [name],  subsequent newly placed RV defibrillator lead has fractured.  ICD therapies programmed off 6/15  . Myasthenia gravis    mild  . Nonischemic cardiomyopathy (HCC)    EF improved from 20% to 50% with CRT  . Obesity   . Pickwickian syndrome ([place])   . Schwannoma    rest[facility] at [facility]   Past Surgical History:  Procedure Laterality Date  . BIV ICD GENERTAOR CHANGE OUT Left 05/23/2011   Procedure: BIV ICD GENERTAOR CHANGE OUT;  Surgeon: [name], MD;  Location: [facility] CATH LAB;  Service: Cardiovascular;  Laterality: Left;  . BIV PACEMAKER GENERATOR CHANGEOUT N/A 03/14/2017   Medtronic Percepta CRT-P MRI [place] model [id number] (SN [id number]) BiV pacemaker.  OF NOTE:  Due to partial failure of the MDT [id number] lead (RV shock/SVC shock coil fracture), I elected to place the pace/ sense portion of this lead into the LV port of the device.  The  MDT 1191 was placed into the RV port on the device.  This will allow LV only pacing should the RV lead fail further.  The patient was   . CARDIAC DEFIBRILLATOR PLACEMENT  09/13/05, 12/07/2007, 05/23/11   MDT BiV ICD implanted by Dr [name], His Fidelis [phone] lead fractured and was replaced by Dr [name] 12/07/2007, Generator change by Dr [name] 05/23/11  . schwannoma resection     removed at [facility]     Current Meds  Medication Sig  . aspirin 81 MG tablet Take 1 tablet (81 mg total) by mouth daily.  . carvedilol (COREG) 12.5 MG tablet Take 1 tablet (12.5 mg total) by mouth 2 (two) times daily.  . Cholecalciferol  (VITAMIN D3) 2000 UNITS TABS Take 1 tablet by mouth daily.  . cyanocobalamin 100 MCG tablet Take 100 mcg by mouth daily.  . cycloSPORINE modified (GENGRAF) 25 MG capsule Take 25 mg by mouth 2 (two) times daily.  Marland Kitchen docusate sodium (COLACE) 100 MG capsule Take 100 mg by mouth daily as needed for mild constipation.  Marland Kitchen losartan (COZAAR) 25 MG tablet Take 1 tablet (25 mg total) by mouth daily.  . Multiple Vitamins-Minerals (MENS MULTI VITAMIN & MINERAL PO) Take 1 tablet by mouth daily.     Allergies:   Penicillins   Social History   Tobacco Use  . Smoking status: Never Smoker  . Smokeless tobacco: Never Used  Substance Use Topics  . Alcohol use: No  . Drug use: No     Family Hx: The patient's family history includes Lung disease in his father. There is no history of Heart attack or Stroke.  ROS:   Please see the history of present illness.    General:no colds or fevers, + weight loss may be due to being at home. Skin:no rashes or ulcers HEENT:no blurred vision, no congestion CV:see HPI PUL:see HPI GI:no diarrhea constipation or melena, no indigestion GU:no hematuria, no dysuria MS:no joint pain, no claudication Neuro:no syncope, no lightheadedness Endo:no diabetes, no thyroid disease  All other systems reviewed and are negative.   Prior CV studies:   The following studies were reviewed today:  Echo 08/18/13 Study Conclusions   - Left ventricle: The cavity size was normal. Wall thickness was  increased in a pattern of mild LVH. Systolic function was  moderately reduced. The estimated ejection fraction was in the  range of 35% to 40%. Diffuse hypokinesis. Features are consistent  with a pseudonormal left ventricular filling pattern, with  concomitant abnormal relaxation and increased filling pressure  (grade 2 diastolic dysfunction).  - Ventricular septum: There is marked septal-lateral and  apical-basal dyssynchrony due to abnormal electrical activation.   - Aortic valve: There was mild regurgitation.  - Mitral valve: Calcified annulus.  - Left atrium: The atrium was moderately dilated.   Last pacer check 06/15/18 with normal device function, stable battery    Labs/Other Tests and Data Reviewed:    EKG:  An ECG dated 02/23/18 was personally reviewed today and demonstrated:  SR with V pacing and PVCs one couplet.   Recent Labs: No results found for requested labs within last 8760 hours.   Recent Lipid Panel No results found for: CHOL, TRIG, HDL, CHOLHDL, LDLCALC, LDLDIRECT  Wt Readings from Last 3 Encounters:  07/22/18 200 lb (90.7 kg)  02/23/18 228 lb (103.4 kg)  06/16/17 228 lb (103.4 kg)     Objective:    Vital Signs:  BP 134/88   Pulse (!) 101   Ht  6' (1.829 m)   Wt 200 lb (90.7 kg)   BMI 27.12 kg/m    VITAL SIGNS:  reviewed  General Male with strong voice, NAD Neuro: ALERT and oriented  X 3  Answers questions approp. Lungs can complete sentences without SOB Psych: pleasant affect   ASSESSMENT & PLAN:    1. NICM EF 40% he uses lasix as needed, denies SOB no edema.   2. BiV pacer in place last interrogated in May. Stable.  3. PVCs pt not aware of skipped beats, coreg increased on lasat visit and no lightheadedness  4. Chronic systolic HF  Euvolemic.   5. Obesity is losing wt.     May move to [place] this year.   COVID-19 Education: The signs and symptoms of COVID-19 were discussed with the patient and how to seek care for testing (follow up with PCP or arrange E-visit).  The importance of social distancing was discussed today.  Time:   Today, I have spent 12 minutes with the patient with telehealth technology discussing the above problems.     Medication Adjustments/Labs and Tests Ordered: Current medicines are reviewed at length with the patient today.  Concerns regarding medicines are outlined above.   Tests Ordered: No orders of the defined types were placed in this encounter.   Medication Changes: No  orders of the defined types were placed in this encounter.   Follow Up:  In Person in 6 month(s)  Signed, Cecilie Kicks, NP  07/22/2018 9:31 AM    [place]

## 2018-07-22 ENCOUNTER — Telehealth (INDEPENDENT_AMBULATORY_CARE_PROVIDER_SITE_OTHER): Payer: Medicare Other | Admitting: Cardiology

## 2018-07-22 ENCOUNTER — Emergency Department (HOSPITAL_COMMUNITY): Payer: Medicare Other

## 2018-07-22 ENCOUNTER — Encounter: Payer: Self-pay | Admitting: Cardiology

## 2018-07-22 ENCOUNTER — Other Ambulatory Visit: Payer: Self-pay

## 2018-07-22 ENCOUNTER — Inpatient Hospital Stay (HOSPITAL_COMMUNITY)
Admission: EM | Admit: 2018-07-22 | Discharge: 2018-07-24 | DRG: 381 | Disposition: A | Discharge status: Home / Self Care | Payer: Medicare Other | Attending: Internal Medicine | Admitting: Internal Medicine

## 2018-07-22 ENCOUNTER — Encounter (HOSPITAL_COMMUNITY): Payer: Self-pay

## 2018-07-22 VITALS — BP 134/88 | HR 101 | Ht 72.0 in | Wt 200.0 lb

## 2018-07-22 DIAGNOSIS — Z20828 Contact with and (suspected) exposure to other viral communicable diseases: Secondary | ICD-10-CM | POA: Diagnosis present

## 2018-07-22 DIAGNOSIS — I5022 Chronic systolic (congestive) heart failure: Secondary | ICD-10-CM | POA: Diagnosis present

## 2018-07-22 DIAGNOSIS — I428 Other cardiomyopathies: Secondary | ICD-10-CM | POA: Diagnosis not present

## 2018-07-22 DIAGNOSIS — Z7982 Long term (current) use of aspirin: Secondary | ICD-10-CM

## 2018-07-22 DIAGNOSIS — K269 Duodenal ulcer, unspecified as acute or chronic, without hemorrhage or perforation: Secondary | ICD-10-CM | POA: Diagnosis present

## 2018-07-22 DIAGNOSIS — I493 Ventricular premature depolarization: Secondary | ICD-10-CM

## 2018-07-22 DIAGNOSIS — K222 Esophageal obstruction: Secondary | ICD-10-CM | POA: Diagnosis present

## 2018-07-22 DIAGNOSIS — R630 Anorexia: Secondary | ICD-10-CM | POA: Diagnosis present

## 2018-07-22 DIAGNOSIS — G7000 Myasthenia gravis without (acute) exacerbation: Secondary | ICD-10-CM | POA: Diagnosis present

## 2018-07-22 DIAGNOSIS — N184 Chronic kidney disease, stage 4 (severe): Secondary | ICD-10-CM | POA: Diagnosis present

## 2018-07-22 DIAGNOSIS — Z23 Encounter for immunization: Secondary | ICD-10-CM

## 2018-07-22 DIAGNOSIS — K921 Melena: Secondary | ICD-10-CM | POA: Diagnosis not present

## 2018-07-22 DIAGNOSIS — K2211 Ulcer of esophagus with bleeding: Principal | ICD-10-CM | POA: Diagnosis present

## 2018-07-22 DIAGNOSIS — D649 Anemia, unspecified: Secondary | ICD-10-CM

## 2018-07-22 DIAGNOSIS — K449 Diaphragmatic hernia without obstruction or gangrene: Secondary | ICD-10-CM | POA: Diagnosis present

## 2018-07-22 DIAGNOSIS — I5023 Acute on chronic systolic (congestive) heart failure: Secondary | ICD-10-CM | POA: Diagnosis present

## 2018-07-22 DIAGNOSIS — E662 Morbid (severe) obesity with alveolar hypoventilation: Secondary | ICD-10-CM | POA: Diagnosis present

## 2018-07-22 DIAGNOSIS — K922 Gastrointestinal hemorrhage, unspecified: Secondary | ICD-10-CM | POA: Diagnosis present

## 2018-07-22 DIAGNOSIS — Z801 Family history of malignant neoplasm of trachea, bronchus and lung: Secondary | ICD-10-CM

## 2018-07-22 DIAGNOSIS — F329 Major depressive disorder, single episode, unspecified: Secondary | ICD-10-CM | POA: Diagnosis present

## 2018-07-22 DIAGNOSIS — T82190A Other mechanical complication of cardiac electrode, initial encounter: Secondary | ICD-10-CM | POA: Diagnosis present

## 2018-07-22 DIAGNOSIS — Z95810 Presence of automatic (implantable) cardiac defibrillator: Secondary | ICD-10-CM

## 2018-07-22 DIAGNOSIS — Z79899 Other long term (current) drug therapy: Secondary | ICD-10-CM

## 2018-07-22 DIAGNOSIS — K648 Other hemorrhoids: Secondary | ICD-10-CM | POA: Diagnosis present

## 2018-07-22 DIAGNOSIS — Z950 Presence of cardiac pacemaker: Secondary | ICD-10-CM

## 2018-07-22 DIAGNOSIS — D62 Acute posthemorrhagic anemia: Secondary | ICD-10-CM | POA: Diagnosis present

## 2018-07-22 DIAGNOSIS — K5730 Diverticulosis of large intestine without perforation or abscess without bleeding: Secondary | ICD-10-CM | POA: Diagnosis present

## 2018-07-22 DIAGNOSIS — R0602 Shortness of breath: Secondary | ICD-10-CM | POA: Diagnosis not present

## 2018-07-22 DIAGNOSIS — Z86010 Personal history of colonic polyps: Secondary | ICD-10-CM

## 2018-07-22 DIAGNOSIS — Z880 Allergy status to penicillin: Secondary | ICD-10-CM

## 2018-07-22 DIAGNOSIS — N179 Acute kidney failure, unspecified: Secondary | ICD-10-CM | POA: Diagnosis present

## 2018-07-22 NOTE — ED Provider Notes (Signed)
[place] EMERGENCY DEPARTMENT Provider Note   CSN: [id number] Arrival date & time: 07/22/18  2321     History   Chief Complaint Chief Complaint  Patient presents with  . Shortness of Breath    HPI [name] is a 85 y.o. male.     The history is provided by the patient, the EMS personnel and a relative.  Shortness of Breath Severity:  Moderate Onset quality:  Gradual Duration:  3 weeks Timing:  Intermittent Progression:  Worsening Chronicity:  New Context: activity   Relieved by:  Rest Worsened by:  Activity Associated symptoms: no abdominal pain, no chest pain, no cough, no fever and no vomiting   Patient with history of CHF, depression, myasthenia gravis presents with shortness of breath.  Patient reports for the past 3 weeks has had exertional dyspnea.  He reports over the past 3 days it abruptly worsened.  He is also noted dark stool over the past several days.  No chest pain, no fever, no cough. Denies any pain complaints.  He is not on anticoagulants  Past Medical History:  Diagnosis Date  . Chronic systolic congestive heart failure ([place])   . Depression   . DJD (degenerative joint disease)   . Implantable cardioverter-defibrillator lead failure    s/p lead revision in 2010 by Dr [name],  subsequent newly placed RV defibrillator lead has fractured.  ICD therapies programmed off 6/15  . Myasthenia gravis    mild  . Nonischemic cardiomyopathy (HCC)    EF improved from 20% to 50% with CRT  . Obesity   . Pickwickian syndrome ([place])   . Schwannoma    rested at [facility]    Patient Active Problem List   Diagnosis Date Noted  . Myasthenia gravis, AChR antibody positive ([place]) 06/16/2017  . Implanted defibrillator electrode lead fracture 08/09/2013  . Obesity 11/30/2012  . Systolic dysfunction with acute on chronic heart failure ([place]) 05/19/2011  . Other primary cardiomyopathies 05/16/2011  . Congestive heart failure ([place]) 05/16/2011     Past Surgical History:  Procedure Laterality Date  . BIV ICD GENERTAOR CHANGE OUT Left 05/23/2011   Procedure: BIV ICD GENERTAOR CHANGE OUT;  Surgeon: [name], MD;  Location: [facility] CATH LAB;  Service: Cardiovascular;  Laterality: Left;  . BIV PACEMAKER GENERATOR CHANGEOUT N/A 03/14/2017   Medtronic Percepta CRT-P MRI [place] model [id number] (SN [id number]) BiV pacemaker.  OF NOTE:  Due to partial failure of the MDT [phone] lead (RV shock/SVC shock coil fracture), I elected to place the pace/ sense portion of this lead into the LV port of the device.  The MDT 5093 was placed into the RV port on the device.  This will allow LV only pacing should the RV lead fail further.  The patient was   . CARDIAC DEFIBRILLATOR PLACEMENT  09/13/05, 12/07/2007, 05/23/11   MDT BiV ICD implanted by Dr [name], His Fidelis [phone] lead fractured and was replaced by Dr [name] 12/07/2007, Generator change by Dr [name] 05/23/11  . schwannoma resection     removed at [facility] Medications    Prior to Admission medications   Medication Sig Start Date End Date Taking? Authorizing Provider  aspirin 81 MG tablet Take 1 tablet (81 mg total) by mouth daily. 02/23/18   Jerline Pain, MD  carvedilol (COREG) 12.5 MG tablet Take 1 tablet (12.5 mg total) by mouth 2 (two) times daily. 02/23/18   Jerline Pain, MD  Cholecalciferol (VITAMIN D3) 2000 UNITS TABS Take 1 tablet by mouth daily.    [provider]  cyanocobalamin 100 MCG tablet Take 100 mcg by mouth daily.    [provider]  cycloSPORINE modified (GENGRAF) 25 MG capsule Take 25 mg by mouth 2 (two) times daily.    [provider]  docusate sodium (COLACE) 100 MG capsule Take 100 mg by mouth daily as needed for mild constipation.    [provider]  losartan (COZAAR) 25 MG tablet Take 1 tablet (25 mg total) by mouth daily. 02/23/18   Jerline Pain, MD  Multiple Vitamins-Minerals (MENS MULTI VITAMIN & MINERAL PO) Take 1 tablet by mouth  daily.    [provider]    Family History Family History  Problem Relation Age of Onset  . Lung disease Father   . Heart attack Neg Hx   . Stroke Neg Hx     Social History Social History   Tobacco Use  . Smoking status: Never Smoker  . Smokeless tobacco: Never Used  Substance Use Topics  . Alcohol use: No  . Drug use: No     Allergies   Penicillins   Review of Systems Review of Systems  Constitutional: Negative for fever.  Respiratory: Positive for shortness of breath. Negative for cough.   Cardiovascular: Negative for chest pain.  Gastrointestinal: Negative for abdominal pain and vomiting.       +Melena  All other systems reviewed and are negative.    Physical Exam Updated Vital Signs Pulse 69   Temp 98.1 F (36.7 C) (Oral)   Resp 13   Ht 1.829 m (6')   Wt 90.7 kg   SpO2 100%   BMI 27.12 kg/m   Physical Exam  CONSTITUTIONAL: Elderly, no acute distress HEAD: Normocephalic/atraumatic EYES: EOMI/PERRL, conjunctival pale ENMT: Mucous membranes moist NECK: supple no meningeal signs SPINE/BACK:entire spine nontender, kyphotic spine CV: S1/S2 noted LUNGS: Lungs are clear to auscultation bilaterally, no apparent distress ABDOMEN: soft, nontender, no rebound or guarding, bowel sounds noted throughout abdomen GU:no cva tenderness Rectal - dark stool, chaperone present, no masses noted NEURO: Pt is awake/alert/appropriate, moves all extremitiesx4.  No facial droop.   EXTREMITIES: pulses normal/equal, full ROM, no lower extremity edema SKIN: warm, color normal PSYCH: no abnormalities of mood noted, alert and oriented to situation  ED Treatments / Results  Labs (all labs ordered are listed, but only abnormal results are displayed) Labs Reviewed  BASIC METABOLIC PANEL - Abnormal; Notable for the following components:      Result Value   Sodium 131 (*)    CO2 19 (*)    Glucose, Bld 121 (*)    BUN 28 (*)    Creatinine, Ser 2.07 (*)    Calcium  8.2 (*)    GFR calc non Af Amer 29 (*)    GFR calc Af Amer 34 (*)    All other components within normal limits  CBC WITH DIFFERENTIAL/PLATELET - Abnormal; Notable for the following components:   RBC 2.79 (*)    Hemoglobin 8.5 (*)    HCT 26.3 (*)    Neutro Abs 9.3 (*)    Lymphs Abs 0.4 (*)    All other components within normal limits  PROTIME-INR - Abnormal; Notable for the following components:   Prothrombin Time 16.2 (*)    INR 1.3 (*)    All other components within normal limits  POC OCCULT BLOOD, ED - Abnormal; Notable for the following components:  Fecal Occult Bld POSITIVE (*)    All other components within normal limits  TYPE AND SCREEN  PREPARE RBC (CROSSMATCH)  ABO/RH    EKG    Radiology Dg Chest Port 1 View  Result Date: 07/22/2018 CLINICAL DATA:  85 y/o  M; 2 days of shortness of breath. EXAM: PORTABLE CHEST 1 VIEW COMPARISON:  08/04/2013 chest radiograph FINDINGS: Stable enlarged cardiac silhouette given projection and technique. Three lead pacemaker. Aortic calcific atherosclerosis. No consolidation, effusion, or pneumothorax. No acute osseous abnormality is evident. Azygos fissure noted. IMPRESSION: No acute pulmonary process identified. Stable enlarged cardiac silhouette. Electronically Signed   By: Kristine Garbe M.D.   On: 07/22/2018 23:51    Procedures Procedures  CRITICAL CARE Performed by: Sharyon Cable Total critical care time: 40 minutes Critical care time was exclusive of separately billable procedures and treating other patients. Critical care was necessary to treat or prevent imminent or life-threatening deterioration. Critical care was time spent personally by me on the following activities: development of treatment plan with patient and/or surrogate as well as nursing, discussions with consultants, evaluation of patient's response to treatment, examination of patient, obtaining history from patient or surrogate, ordering and performing  treatments and interventions, ordering and review of laboratory studies, ordering and review of radiographic studies, pulse oximetry and re-evaluation of patient's condition. PATIENT WITH ACUTE ANEMIA REQUIRING BLOOD TRANSFUSION AND ADMISSION TO THE HOSPITAL  Medications Ordered in ED Medications  0.9 %  sodium chloride infusion (has no administration in time range)  pantoprazole (PROTONIX) injection 40 mg (has no administration in time range)     Initial Impression / Assessment and Plan / ED Course  I have reviewed the triage vital signs and the nursing notes.  Pertinent labs & imaging results that were available during my care of the patient were reviewed by me and considered in my medical decision making (see chart for details).        11:45 PM Patient with multiple medical conditions presented with dyspnea on exertion as well as dark stool.  Strong suspicion for acute anemia.  Labs and imaging are pending at this time.  Will likely need admission.  Patient agrees to receive blood transfusion if necessary. 2:12 AM PT WITH EVIDENCE OF ACTIVE GI BLEED, HEMOCCULT POSITIVE HE HAS ACUTE ANEMIA, PRBC HAS BEEN ORDERED DUE TO SYMPTOMS/HISTORY WILL ADMIT D/W DR [facility] FOR ADMISSION PT STABILIZED IN THE ER  D/W SON VIA PHONE ON ARRIVAL TO ROOM Final Clinical Impressions(s) / ED Diagnoses   Final diagnoses:  Acute anemia  AKI (acute kidney injury) [facility])    ED Discharge Orders    None       Ripley Fraise, MD 07/23/18 [phone]

## 2018-07-22 NOTE — Patient Instructions (Signed)
Medication Instructions:  Your physician recommends that you continue on your current medications as directed. Please refer to the Current Medication list given to you today.  If you need a refill on your cardiac medications before your next appointment, please call your pharmacy.   Lab work: None ordered  If you have labs (blood work) drawn today and your tests are completely normal, you will receive your results only by: Marland Kitchen MyChart Message (if you have MyChart) OR . A paper copy in the mail If you have any lab test that is abnormal or we need to change your treatment, we will call you to review the results.  Testing/Procedures: None ordered  Follow-Up: At [facility], you and your health needs are our priority.  As part of our continuing mission to provide you with exceptional heart care, we have created designated Provider Care Teams.  These Care Teams include your primary Cardiologist (physician) and Advanced Practice Providers (APPs -  Physician Assistants and Nurse Practitioners) who all work together to provide you with the care you need, when you need it. You will need a follow up appointment in 6 months.  Please call our office 2 months in advance to schedule this appointment.  You may see [name], MD or one of the following Advanced Practice Providers on your designated Care Team:   Truitt Merle, NP Cecilie Kicks, NP . Kathyrn Drown, NP  Any Other Special Instructions Will Be Listed Below (If Applicable).

## 2018-07-23 ENCOUNTER — Encounter (HOSPITAL_COMMUNITY): Payer: Self-pay | Admitting: Internal Medicine

## 2018-07-23 DIAGNOSIS — Z880 Allergy status to penicillin: Secondary | ICD-10-CM | POA: Diagnosis not present

## 2018-07-23 DIAGNOSIS — G7000 Myasthenia gravis without (acute) exacerbation: Secondary | ICD-10-CM | POA: Diagnosis present

## 2018-07-23 DIAGNOSIS — Z7982 Long term (current) use of aspirin: Secondary | ICD-10-CM | POA: Diagnosis not present

## 2018-07-23 DIAGNOSIS — K648 Other hemorrhoids: Secondary | ICD-10-CM | POA: Diagnosis present

## 2018-07-23 DIAGNOSIS — K264 Chronic or unspecified duodenal ulcer with hemorrhage: Secondary | ICD-10-CM | POA: Diagnosis not present

## 2018-07-23 DIAGNOSIS — K2210 Ulcer of esophagus without bleeding: Secondary | ICD-10-CM | POA: Diagnosis present

## 2018-07-23 DIAGNOSIS — R0602 Shortness of breath: Secondary | ICD-10-CM | POA: Diagnosis present

## 2018-07-23 DIAGNOSIS — I428 Other cardiomyopathies: Secondary | ICD-10-CM | POA: Diagnosis present

## 2018-07-23 DIAGNOSIS — K921 Melena: Secondary | ICD-10-CM | POA: Diagnosis present

## 2018-07-23 DIAGNOSIS — N179 Acute kidney failure, unspecified: Secondary | ICD-10-CM | POA: Diagnosis present

## 2018-07-23 DIAGNOSIS — Z86010 Personal history of colonic polyps: Secondary | ICD-10-CM | POA: Diagnosis not present

## 2018-07-23 DIAGNOSIS — E662 Morbid (severe) obesity with alveolar hypoventilation: Secondary | ICD-10-CM | POA: Diagnosis present

## 2018-07-23 DIAGNOSIS — Z79899 Other long term (current) drug therapy: Secondary | ICD-10-CM | POA: Diagnosis not present

## 2018-07-23 DIAGNOSIS — K922 Gastrointestinal hemorrhage, unspecified: Secondary | ICD-10-CM

## 2018-07-23 DIAGNOSIS — N184 Chronic kidney disease, stage 4 (severe): Secondary | ICD-10-CM | POA: Diagnosis present

## 2018-07-23 DIAGNOSIS — R630 Anorexia: Secondary | ICD-10-CM | POA: Diagnosis present

## 2018-07-23 DIAGNOSIS — D62 Acute posthemorrhagic anemia: Secondary | ICD-10-CM | POA: Diagnosis present

## 2018-07-23 DIAGNOSIS — Z801 Family history of malignant neoplasm of trachea, bronchus and lung: Secondary | ICD-10-CM | POA: Diagnosis not present

## 2018-07-23 DIAGNOSIS — I5022 Chronic systolic (congestive) heart failure: Secondary | ICD-10-CM | POA: Diagnosis present

## 2018-07-23 DIAGNOSIS — K2211 Ulcer of esophagus with bleeding: Secondary | ICD-10-CM | POA: Diagnosis present

## 2018-07-23 DIAGNOSIS — K449 Diaphragmatic hernia without obstruction or gangrene: Secondary | ICD-10-CM | POA: Diagnosis present

## 2018-07-23 DIAGNOSIS — K269 Duodenal ulcer, unspecified as acute or chronic, without hemorrhage or perforation: Secondary | ICD-10-CM | POA: Diagnosis present

## 2018-07-23 DIAGNOSIS — Z95810 Presence of automatic (implantable) cardiac defibrillator: Secondary | ICD-10-CM | POA: Diagnosis not present

## 2018-07-23 DIAGNOSIS — F329 Major depressive disorder, single episode, unspecified: Secondary | ICD-10-CM | POA: Diagnosis present

## 2018-07-23 DIAGNOSIS — Z20828 Contact with and (suspected) exposure to other viral communicable diseases: Secondary | ICD-10-CM | POA: Diagnosis present

## 2018-07-23 DIAGNOSIS — Z23 Encounter for immunization: Secondary | ICD-10-CM | POA: Diagnosis present

## 2018-07-23 DIAGNOSIS — K222 Esophageal obstruction: Secondary | ICD-10-CM | POA: Diagnosis present

## 2018-07-23 DIAGNOSIS — K5730 Diverticulosis of large intestine without perforation or abscess without bleeding: Secondary | ICD-10-CM | POA: Diagnosis present

## 2018-07-23 LAB — CBC WITH DIFFERENTIAL/PLATELET
Abs Immature Granulocytes: 0.04 10*3/uL (ref 0.00–0.07)
Basophils Absolute: 0 10*3/uL (ref 0.0–0.1)
Basophils Relative: 0 %
Eosinophils Absolute: 0.1 10*3/uL (ref 0.0–0.5)
Eosinophils Relative: 1 %
HCT: 26.3 % — ABNORMAL LOW (ref 39.0–52.0)
Hemoglobin: 8.5 g/dL — ABNORMAL LOW (ref 13.0–17.0)
Immature Granulocytes: 0 %
Lymphocytes Relative: 4 %
Lymphs Abs: 0.4 10*3/uL — ABNORMAL LOW (ref 0.7–4.0)
MCH: 30.5 pg (ref 26.0–34.0)
MCHC: 32.3 g/dL (ref 30.0–36.0)
MCV: 94.3 fL (ref 80.0–100.0)
Monocytes Absolute: 0.4 10*3/uL (ref 0.1–1.0)
Monocytes Relative: 4 %
Neutro Abs: 9.3 10*3/uL — ABNORMAL HIGH (ref 1.7–7.7)
Neutrophils Relative %: 91 %
Platelets: 325 10*3/uL (ref 150–400)
RBC: 2.79 MIL/uL — ABNORMAL LOW (ref 4.22–5.81)
RDW: 14.3 % (ref 11.5–15.5)
WBC: 10.2 10*3/uL (ref 4.0–10.5)
nRBC: 0 % (ref 0.0–0.2)

## 2018-07-23 LAB — BASIC METABOLIC PANEL
Anion gap: 10 (ref 5–15)
BUN: 28 mg/dL — ABNORMAL HIGH (ref 8–23)
CO2: 19 mmol/L — ABNORMAL LOW (ref 22–32)
Calcium: 8.2 mg/dL — ABNORMAL LOW (ref 8.9–10.3)
Chloride: 102 mmol/L (ref 98–111)
Creatinine, Ser: 2.07 mg/dL — ABNORMAL HIGH (ref 0.61–1.24)
GFR calc Af Amer: 34 mL/min — ABNORMAL LOW (ref >60)
GFR calc non Af Amer: 29 mL/min — ABNORMAL LOW (ref >60)
Glucose, Bld: 121 mg/dL — ABNORMAL HIGH (ref 70–99)
Potassium: 4.8 mmol/L (ref 3.5–5.1)
Sodium: 131 mmol/L — ABNORMAL LOW (ref 135–145)

## 2018-07-23 LAB — PREPARE RBC (CROSSMATCH)

## 2018-07-23 LAB — PROTIME-INR
INR: 1.3 — ABNORMAL HIGH (ref 0.8–1.2)
Prothrombin Time: 16.2 seconds — ABNORMAL HIGH (ref 11.4–15.2)

## 2018-07-23 LAB — GLUCOSE, CAPILLARY
Glucose-Capillary: 104 mg/dL — ABNORMAL HIGH (ref 70–99)
Glucose-Capillary: 64 mg/dL — ABNORMAL LOW (ref 70–99)
Glucose-Capillary: 73 mg/dL (ref 70–99)
Glucose-Capillary: 88 mg/dL (ref 70–99)

## 2018-07-23 LAB — ABO/RH: ABO/RH(D): O POS

## 2018-07-23 LAB — HEMOGLOBIN AND HEMATOCRIT, BLOOD
HCT: 29.2 % — ABNORMAL LOW (ref 39.0–52.0)
Hemoglobin: 9.6 g/dL — ABNORMAL LOW (ref 13.0–17.0)

## 2018-07-23 LAB — SARS CORONAVIRUS 2: SARS Coronavirus 2: NOT DETECTED

## 2018-07-23 LAB — POC OCCULT BLOOD, ED: Fecal Occult Bld: POSITIVE — AB

## 2018-07-23 MED ORDER — ACETAMINOPHEN 650 MG RE SUPP: 650.0000 mg | Freq: Four times a day (QID) | RECTAL | Status: DC | PRN

## 2018-07-23 MED ORDER — ACETAMINOPHEN 325 MG PO TABS: 650.0000 mg | ORAL_TABLET | Freq: Four times a day (QID) | ORAL | Status: DC | PRN

## 2018-07-23 MED ORDER — PANTOPRAZOLE SODIUM 40 MG IV SOLR: 40.0000 mg | Freq: Two times a day (BID) | INTRAVENOUS | Status: DC

## 2018-07-23 MED ORDER — PANTOPRAZOLE SODIUM 40 MG IV SOLR
40.0000 mg | Freq: Once | INTRAVENOUS | Status: AC
  Administered 2018-07-23: 40 mg via INTRAVENOUS
  Filled 2018-07-23: qty 40

## 2018-07-23 MED ORDER — CYCLOSPORINE MODIFIED (NEORAL) 25 MG PO CAPS
25.0000 mg | ORAL_CAPSULE | Freq: Two times a day (BID) | ORAL | Status: DC
  Administered 2018-07-23 – 2018-07-24 (×3): 25 mg via ORAL
  Filled 2018-07-23 (×4): qty 1

## 2018-07-23 MED ORDER — CARVEDILOL 12.5 MG PO TABS
12.5000 mg | ORAL_TABLET | Freq: Two times a day (BID) | ORAL | Status: DC
  Administered 2018-07-23: 12.5 mg via ORAL
  Filled 2018-07-23 (×3): qty 1

## 2018-07-23 MED ORDER — SODIUM CHLORIDE 0.9 % IV SOLN
8.0000 mg/h | INTRAVENOUS | Status: DC
  Administered 2018-07-23: 8 mg/h via INTRAVENOUS
  Filled 2018-07-23: qty 80

## 2018-07-23 MED ORDER — SODIUM CHLORIDE 0.9 % IV SOLN
INTRAVENOUS | Status: DC
  Administered 2018-07-23 – 2018-07-24 (×2): via INTRAVENOUS

## 2018-07-23 MED ORDER — PNEUMOCOCCAL VAC POLYVALENT 25 MCG/0.5ML IJ INJ
0.5000 mL | INJECTION | INTRAMUSCULAR | Status: AC
  Administered 2018-07-24: 0.5 mL via INTRAMUSCULAR
  Filled 2018-07-23: qty 0.5

## 2018-07-23 MED ORDER — SODIUM CHLORIDE 0.9 % IV SOLN
10.0000 mL/h | Freq: Once | INTRAVENOUS | Status: AC
  Administered 2018-07-23: 10 mL/h via INTRAVENOUS

## 2018-07-23 NOTE — Progress Notes (Signed)
PROGRESS NOTE    Dartagnan Beavers Gamel  KYH:[id number] DOB: [birth year] DOA: 07/22/2018 PCP: [name], MD   Brief Narrative:  Farrel Gobble Sinclair is a 85 y.o. male with history of nonischemic cardiomyopathy status post pacemaker placement, myasthenia gravis pickwickian syndrome presents to the ER with complaint of increasing shortness of breath ongoing for last 1 week.  Shortness of breath is mostly exertional.  Denies any chest pain fever chills productive cough nausea vomiting diarrhea peer denies any difficulty speaking swallowing any ptosis or extremity weakness. In the ER patient was hemodynamically stable chest x-ray unremarkable EKG shows paced rhythm.  Patient's blood work showed hemoglobin of 8.5 and creatinine of 2.  Hemoglobin last was 14.4 almost a year ago.  Patient has not noticed any blood in the stool or black stool denies using frequently any NSAIDs.  Creatinine also has increased from baseline.  Stool for occult blood was positive and is melanotic.  Patient admitted for acute GI bleed.  Shortness of breath likely from symptomatic anemia.  No signs of any myasthenia gravis exacerbation.   Assessment & Plan:   Principal Problem:   Acute GI bleeding Active Problems:   Systolic dysfunction with acute on chronic heart failure (HCC)   Implanted defibrillator electrode lead fracture   Myasthenia gravis, AChR antibody positive (HCC)   Acute blood loss anemia  Acute GI bleed, unclear source, POA GI asked to follow, patient had previous scope with [facility] in 2004 1 unit PRBC administered overnight Multiple episodes of melena previously reported -FOBT positive per report Pantoprazole drip ongoing,  Acute symptomatic blood loss anemia 2/2 above,POA Status post transfusion, now asymptomatic, follow clinically  Repeat CBC with morning labs  AKI, without history CKD Likely in the setting of symptomatic anemia as above, follow with morning labs  Myasthenia Gravis Continue home  medications with cyclosporine,  Chronic systolic HF Currently euvolemic, home medications including carvedilol  DVT prophylaxis: Holding given GI bleed as above Code Status: Full Disposition Plan: Pending further evaluation with GI, likely to require endoscopy given acute symptomatic anemia with noted melena.   Consultants:   [facility]  Procedures:   Pending  Subjective: No acute issues or events this morning, status post transfusion patient feels much improved.  Objective: Vitals:   07/23/18 0417 07/23/18 0455 07/23/18 0741 07/23/18 1238  BP: 115/69 (!) 112/52 105/60 (!) 105/56  Pulse: 70 65 63 (!) 58  Resp: 18 16 17 20   Temp: 98.4 F (36.9 C) 97.8 F (36.6 C) 97.7 F (36.5 C) (!) 97.5 F (36.4 C)  TempSrc: Oral  Oral Oral  SpO2: 100% 100% 100% 100%  Weight:  87.4 kg    Height:  6' (1.829 m)      Intake/Output Summary (Last 24 hours) at 07/23/2018 1426 Last data filed at 07/23/2018 0741 Gross per 24 hour  Intake 1320 ml  Output 200 ml  Net 1120 ml   Filed Weights   07/22/18 2341 07/23/18 0455  Weight: 90.7 kg 87.4 kg    Examination:  General:  Pleasantly resting in bed, No acute distress. HEENT:  Normocephalic atraumatic.  Sclerae nonicteric, noninjected.  Extraocular movements intact bilaterally. Neck:  Without mass or deformity.  Trachea is midline. Lungs:  Clear to auscultate bilaterally without rhonchi, wheeze, or rales. Heart:  Regular rate and rhythm.  Without murmurs, rubs, or gallops. Abdomen:  Soft, nontender, nondistended.  Without guarding or rebound. Extremities: Without cyanosis, clubbing, edema, or obvious deformity. Vascular:  Dorsalis pedis and posterior tibial pulses palpable  bilaterally. Skin:  Warm and dry, no erythema, no ulcerations.     Data Reviewed: I have personally reviewed following labs and imaging studies  CBC: Recent Labs  Lab 07/23/18 0022  WBC 10.2  NEUTROABS 9.3*  HGB 8.5*  HCT 26.3*  MCV 94.3  PLT 409    Basic Metabolic Panel: Recent Labs  Lab 07/23/18 0022  NA 131*  K 4.8  CL 102  CO2 19*  GLUCOSE 121*  BUN 28*  CREATININE 2.07*  CALCIUM 8.2*   GFR: Estimated Creatinine Clearance: 30.2 mL/min (A) (by C-G formula based on SCr of 2.07 mg/dL (H)). Liver Function Tests: No results for input(s): AST, ALT, ALKPHOS, BILITOT, PROT, ALBUMIN in the last 168 hours. No results for input(s): LIPASE, AMYLASE in the last 168 hours. No results for input(s): AMMONIA in the last 168 hours. Coagulation Profile: Recent Labs  Lab 07/23/18 0022  INR 1.3*   Cardiac Enzymes: No results for input(s): CKTOTAL, CKMB, CKMBINDEX, TROPONINI in the last 168 hours. BNP (last 3 results) No results for input(s): PROBNP in the last 8760 hours. HbA1C: No results for input(s): HGBA1C in the last 72 hours. CBG: Recent Labs  Lab 07/23/18 0657 07/23/18 1232  GLUCAP 104* 64*   Lipid Profile: No results for input(s): CHOL, HDL, LDLCALC, TRIG, CHOLHDL, LDLDIRECT in the last 72 hours. Thyroid Function Tests: No results for input(s): TSH, T4TOTAL, FREET4, T3FREE, THYROIDAB in the last 72 hours. Anemia Panel: No results for input(s): VITAMINB12, FOLATE, FERRITIN, TIBC, IRON, RETICCTPCT in the last 72 hours. Sepsis Labs: No results for input(s): PROCALCITON, LATICACIDVEN in the last 168 hours.  Recent Results (from the past 240 hour(s))  SARS Coronavirus 2     Status: None   Collection Time: 07/23/18  3:07 AM  Result Value Ref Range Status   SARS Coronavirus 2 NOT DETECTED NOT DETECTED Final    Comment: (NOTE) SARS-CoV-2 target nucleic acids are NOT DETECTED. The SARS-CoV-2 RNA is generally detectable in upper and lower respiratory specimens during the acute phase of infection.  Negative  results do not preclude SARS-CoV-2 infection, do not rule out co-infections with other pathogens, and should not be used as the sole basis for treatment or other patient management decisions.  Negative results must be  combined with clinical observations, patient history, and epidemiological information. The expected result is Not Detected. Fact Sheet for Patients: http://www.biofiredefense.com/wp-content/uploads/2020/03/BIOFIRE-COVID -19-patients.pdf Fact Sheet for Healthcare Providers: http://www.biofiredefense.com/wp-content/uploads/2020/03/BIOFIRE-COVID -19-hcp.pdf This test is not yet approved or cleared by the [place] and  has been authorized for detection and/or diagnosis of SARS-CoV-2 by FDA under an Emergency Use Authorization (EUA).  This EUA will remain in effec t (meaning this test can be used) for the duration of  the COVID-19 declaration under Section 564(b)(1) of the Act, 21 U.S.C. section 360bbb-3(b)(1), unless the authorization is terminated or revoked sooner. Performed at [facility], [place] [address] [place], [place] [address]          Radiology Studies: Dg Chest Port 1 View  Result Date: 07/22/2018 CLINICAL DATA:  85 y/o  M; 2 days of shortness of breath. EXAM: PORTABLE CHEST 1 VIEW COMPARISON:  08/04/2013 chest radiograph FINDINGS: Stable enlarged cardiac silhouette given projection and technique. Three lead pacemaker. Aortic calcific atherosclerosis. No consolidation, effusion, or pneumothorax. No acute osseous abnormality is evident. Azygos fissure noted. IMPRESSION: No acute pulmonary process identified. Stable enlarged cardiac silhouette. Electronically Signed   By: Kristine Garbe M.D.   On: 07/22/2018 23:51  Scheduled Meds: . carvedilol  12.5 mg Oral BID WC  . cycloSPORINE modified  25 mg Oral BID  . [START ON 07/26/2018] pantoprazole  40 mg Intravenous Q12H  . [START ON 07/24/2018] pneumococcal 23 valent vaccine  0.5 mL Intramuscular Tomorrow-1000   Continuous Infusions: . pantoprozole (PROTONIX) infusion 8 mg/hr (07/23/18 0749)     LOS: 0 days    Time spent: 51min    Raedyn Klinck C Leontae Bostock, DO [facility]  If  7PM-7AM, please contact night-coverage [email or web address] Password [email or web address] 07/23/2018, 2:26 PM

## 2018-07-23 NOTE — Progress Notes (Signed)
NIF: >-40 VC: 2.3L with good effort

## 2018-07-23 NOTE — Progress Notes (Signed)
NIF = -25 cm H20 VC = 1.9L

## 2018-07-23 NOTE — Consult Note (Addendum)
[facility] Consult: 1:58 PM 07/23/2018  LOS: 0 days    Referring Provider: Dr Avon Gully  Primary Care Physician:  [name], MD Primary Gastroenterologist:  Dr. Carlean Purl     Reason for Consultation: Anemia and tarry stools   HPI: [name] is a 85 y.o. male.  Hx Nonischemic cardiomyopathy, systolic CHF.  Pacemaker/ICD placed 2007, revision 2010, 2013, 2019.  Obesity.  Pickwickian syndrome.  Myasthenia gravis, on cyclosporine. Lap chole 2012  01/2003 colonoscopy.  Dr. Henrene Pastor.  For evaluation hematochezia.  Sigmoid diverticulosis.  Small polyp removed from hepatic flexure.  Internal hemorrhoids.  Polyp was tubular adenoma without HGD I do not see records of any subsequent colonoscopies and there are no upper endoscopies in epic.  Patient himself does not recall ever having had a colonoscopy.  For 1 month he has had anorexia and reduced p.o. intake.  He suspects weight loss of about 13 pounds.  No nausea or vomiting, no dysphagia, no abdominal pain.  About 2 weeks ago for a few days he saw tarry stools, they were black but no gross blood.  Then he did not have bowel movements for a few days but stools intermittently have been tarry, the last tarry stool was yesterday.  He felt dizzy, weak, dyspneic.  Admitted from the ED yesterday with symptomatic anemia, Hgb 8.5, was 14.41  six months prior.  He received 2 PRBC, no repeat Hgb yet. Worse renal function with BUN/creatinine 28/2.0 compared with 19/1.1 four months ago. GFR c/w stage 4 CKD.   FOBT +.   Patient does not drink alcohol.  81 mg aspirin daily.  Uses over-the-counter ibuprofen maybe 2 or 3 times a month at most. Family history negative for colorectal cancer, gastric cancer, stomach or intestinal problems, anemia.  Past Medical History:   Diagnosis Date  . Chronic systolic congestive heart failure ([place])   . Depression   . DJD (degenerative joint disease)   . Implantable cardioverter-defibrillator lead failure    s/p lead revision in 2010 by Dr [name],  subsequent newly placed RV defibrillator lead has fractured.  ICD therapies programmed off 6/15  . Myasthenia gravis    mild  . Nonischemic cardiomyopathy (HCC)    EF improved from 20% to 50% with CRT  . Obesity   . Pickwickian syndrome ([place])   . Schwannoma    rested at [facility]    Past Surgical History:  Procedure Laterality Date  . BIV ICD GENERTAOR CHANGE OUT Left 05/23/2011   Procedure: BIV ICD GENERTAOR CHANGE OUT;  Surgeon: [name], MD;  Location: [facility] CATH LAB;  Service: Cardiovascular;  Laterality: Left;  . BIV PACEMAKER GENERATOR CHANGEOUT N/A 03/14/2017   Medtronic Percepta CRT-P MRI [place] model [id number] (SN [id number]) BiV pacemaker.  OF NOTE:  Due to partial failure of the MDT [id number] lead (RV shock/SVC shock coil fracture), I elected to place the pace/ sense portion of this lead into the LV port of the device.  The MDT 1914 was placed into the RV port on the device.  This will allow LV only  pacing should the RV lead fail further.  The patient was   . CARDIAC DEFIBRILLATOR PLACEMENT  09/13/05, 12/07/2007, 05/23/11   MDT BiV ICD implanted by Dr [name], His [place] [id number] lead fractured and was replaced by Dr [name] 12/07/2007, Generator change by Dr [name] 05/23/11  . schwannoma resection     removed at [facility]    Prior to Admission medications   Medication Sig Start Date End Date Taking? Authorizing Provider  carvedilol (COREG) 12.5 MG tablet Take 1 tablet (12.5 mg total) by mouth 2 (two) times daily. 02/23/18  Yes Jerline Pain, MD  Cholecalciferol (VITAMIN D3) 2000 UNITS TABS Take 1 tablet by mouth daily.   Yes [provider]  cyanocobalamin 100 MCG tablet Take 100 mcg by mouth daily.   Yes [provider]  cycloSPORINE modified (GENGRAF) 25 MG  capsule Take 25 mg by mouth 2 (two) times daily.   Yes [provider]  docusate sodium (COLACE) 100 MG capsule Take 100 mg by mouth daily as needed for mild constipation.   Yes [provider]  losartan (COZAAR) 25 MG tablet Take 1 tablet (25 mg total) by mouth daily. 02/23/18  Yes Jerline Pain, MD  Multiple Vitamins-Minerals (MENS MULTI VITAMIN & MINERAL PO) Take 1 tablet by mouth daily.   Yes [provider]  aspirin 81 MG tablet Take 1 tablet (81 mg total) by mouth daily. Patient not taking: Reported on 07/23/2018 02/23/18   Jerline Pain, MD    Scheduled Meds: . carvedilol  12.5 mg Oral BID WC  . cycloSPORINE modified  25 mg Oral BID  . [START ON 07/26/2018] pantoprazole  40 mg Intravenous Q12H  . [START ON 07/24/2018] pneumococcal 23 valent vaccine  0.5 mL Intramuscular Tomorrow-1000   Infusions: . pantoprozole (PROTONIX) infusion 8 mg/hr (07/23/18 0749)   PRN Meds: acetaminophen **OR** acetaminophen   Allergies as of 07/22/2018 - Review Complete 07/22/2018  Allergen Reaction Noted  . Penicillins Hives, Rash, and Other (See Comments) 05/16/2011    Family History  Problem Relation Age of Onset  . Lung disease Father   . Heart attack Neg Hx   . Stroke Neg Hx     Social History   Socioeconomic History  . Marital status: Divorced    Spouse name: Not on file  . Number of children: Not on file  . Years of education: Not on file  . Highest education level: Not on file  Occupational History  . Not on file  Social Needs  . Financial resource strain: Not on file  . Food insecurity    Worry: Not on file    Inability: Not on file  . Transportation needs    Medical: Not on file    Non-medical: Not on file  Tobacco Use  . Smoking status: Never Smoker  . Smokeless tobacco: Never Used  Substance and Sexual Activity  . Alcohol use: No  . Drug use: No  . Sexual activity: Not on file  Lifestyle  . Physical activity    Days per week: Not on file     Minutes per session: Not on file  . Stress: Not on file  Relationships  . Social Herbalist on phone: Not on file    Gets together: Not on file    Attends religious service: Not on file    Active member of club or organization: Not on file    Attends meetings of clubs or organizations: Not on file  Relationship status: Not on file  . Intimate partner violence    Fear of current or ex partner: Not on file    Emotionally abused: Not on file    Physically abused: Not on file    Forced sexual activity: Not on file  Other Topics Concern  . Not on file  Social History Narrative  . Not on file    REVIEW OF SYSTEMS: Constitutional: Weakness, fatigue. ENT:  No nose bleeds Pulm: Smir on exertion.  Occasional nonpurulent cough.  Does not use supplemental oxygen or CPAP/BiPAP. CV:  No palpitations, no LE edema.  No angina.   GU:  No hematuria, no frequency GI: See HPI. Heme: No excessive or unusual bleeding or bruising. Transfusions: No prior transfusions.  Interestingly patient is not aware that he received 2 units of blood overnight, though he is oriented x3 for me today. Neuro:  No headaches, no peripheral tingling or numbness.  Dizziness, no syncope. Derm:  No itching, no rash or sores.  Endocrine:  No sweats or chills.  No polyuria or dysuria Immunization: Did not inquire as to recent or previous vaccination history. Travel:  None beyond local counties in last few months.    PHYSICAL EXAM: Vital signs in last 24 hours: Vitals:   07/23/18 0741 07/23/18 1238  BP: 105/60 (!) 105/56  Pulse: 63 (!) 58  Resp: 17 20  Temp: 97.7 F (36.5 C) (!) 97.5 F (36.4 C)  SpO2: 100% 100%   Wt Readings from Last 3 Encounters:  07/23/18 87.4 kg  07/22/18 90.7 kg  02/23/18 103.4 kg    General: Elderly, somewhat frail, alert, non-acutely ill-appearing, pale WM Head: No facial asymmetry or swelling.  No signs of head trauma. Eyes: No scleral icterus.  No conjunctival pallor.   EOMI. Ears: Not hard of hearing. Nose: No congestion or discharge Mouth: Moist, pink, clear mucosa.  Tongue midline.  Upper dentures. Neck: No JVD, no masses, no thyromegaly. Lungs: Clear bilaterally though overall reduced breath sounds.  Kyphotic spine.  No labored breathing.  No cough Heart: RRR.  No MRG.  S1, S2 present Abdomen: Soft.  Not tender or distended.  No HSM, masses, bruits, hernias..   Rectal: Deferred exam.  Stools submitted to the lab was FOBT positive Musc/Skeltl: No joint swelling, redness.  Spinal kyphosis. Extremities: No CCE. Neurologic: Oriented x3.  Alert.  Moves all 4 limbs, no tremor.  Limb strength not tested. Skin: No rash, no sores, no suspicious lesions, no telangiectasia Tattoos: None Nodes: Cervical adenopathy Psych: Cooperative, pleasant, fluid speech.  Intake/Output from previous day: 06/17 0701 - 06/18 0700 In: 958 [Blood:958] Out: 200 [Urine:200] Intake/Output this shift: Total I/O In: 362 [Blood:362] Out: -   LAB RESULTS: Recent Labs    07/23/18 0022  WBC 10.2  HGB 8.5*  HCT 26.3*  PLT 325   BMET Lab Results  Component Value Date   NA 131 (L) 07/23/2018   NA 141 03/14/2017   NA 136 06/20/2014   K 4.8 07/23/2018   K 4.2 03/14/2017   K 4.1 06/20/2014   CL 102 07/23/2018   CL 106 03/14/2017   CL 102 06/20/2014   CO2 19 (L) 07/23/2018   CO2 24 03/14/2017   CO2 28 06/20/2014   GLUCOSE 121 (H) 07/23/2018   GLUCOSE 83 03/14/2017   GLUCOSE 91 06/20/2014   BUN 28 (H) 07/23/2018   BUN 19 03/14/2017   BUN 31 (H) 06/20/2014   CREATININE 2.07 (H) 07/23/2018   CREATININE 1.11 03/14/2017  CREATININE 1.13 06/20/2014   CALCIUM 8.2 (L) 07/23/2018   CALCIUM 8.3 (L) 03/14/2017   CALCIUM 8.4 06/20/2014   LFT No results for input(s): PROT, ALBUMIN, AST, ALT, ALKPHOS, BILITOT, BILIDIR, IBILI in the last 72 hours. PT/INR Lab Results  Component Value Date   INR 1.3 (H) 07/23/2018   Hepatitis Panel No results for input(s): HEPBSAG,  HCVAB, HEPAIGM, HEPBIGM in the last 72 hours. C-Diff No components found for: CDIFF Lipase     Component Value Date/Time   LIPASE 14 03/01/2010 0937    Drugs of Abuse  No results found for: LABOPIA, COCAINSCRNUR, LABBENZ, AMPHETMU, THCU, LABBARB   RADIOLOGY STUDIES: Dg Chest Port 1 View  Result Date: 07/22/2018 CLINICAL DATA:  85 y/o  M; 2 days of shortness of breath. EXAM: PORTABLE CHEST 1 VIEW COMPARISON:  08/04/2013 chest radiograph FINDINGS: Stable enlarged cardiac silhouette given projection and technique. Three lead pacemaker. Aortic calcific atherosclerosis. No consolidation, effusion, or pneumothorax. No acute osseous abnormality is evident. Azygos fissure noted. IMPRESSION: No acute pulmonary process identified. Stable enlarged cardiac silhouette. Electronically Signed   By: Kristine Garbe M.D.   On: 07/22/2018 23:51      IMPRESSION:   *    Anemia, normocytic.  No iron, B12, folate studies thus far.  Received PRBC x2 Symptoms of weakness, dizziness, shortness of breath improved following transfusion.  *    Intermittent tarry stool, FOBT positive.  Only GI symptom is anorexia and irregular stools associated with the decreased p.o. intake in the last month.  Has lost about 13 pounds as well.  *   Adenomatous colon polyp 2004, has not had follow-up colonoscopy since then.    PLAN:     *    EGD, patient has been drinking apple juice within the last 30 minutes so we will probably need to delay this until tomorrow.  Dr. Fuller Plan may want to pursue colonoscopy as well given the polyp history but this to be determined.   *   For now, continue with Protonix 40 mg IV twice daily, stop Protonix drip  Azucena Freed  07/23/2018, 1:58 PM Phone [phone]     Attending Physician Note   I have taken a history, examined the patient and reviewed the chart. I agree with the Advanced Practitioner's note, impression and recommendations.  * Anemia with intermittent melena.  R/O ulcer, gastritis, AVMs, etc.  * History of adenomatous colon polyps.   * Protonxi 40 mg IV q12h * EGD tomorrow at 0900  * Given his age no plans for colonoscopy for polyp surveillance however may need to pursue colonoscopy to further investigate bleeding source if EGD is not diagnostic  Lucio Edward, MD [facility] [phone]

## 2018-07-23 NOTE — H&P (Addendum)
History and Physical    [name] WUX:[id number] DOB: May 09, 1933 DOA: 07/22/2018  PCP: [name], MD  Patient coming from: Home.  Chief Complaint: Shortness of breath.  HPI: [name] is a 85 y.o. male with history of nonischemic cardiomyopathy status post pacemaker placement, myasthenia gravis pickwickian syndrome presents to the ER with complaint of increasing shortness of breath ongoing for last 1 week.  Shortness of breath is mostly exertional.  Denies any chest pain fever chills productive cough nausea vomiting diarrhea peer denies any difficulty speaking swallowing any ptosis or extremity weakness.  ED Course: In the ER patient was hemodynamically stable chest x-ray unremarkable EKG shows paced rhythm.  Patient's blood work showed hemoglobin of 8.5 and creatinine of 2.  Hemoglobin last was 14.4 almost a year ago.  Patient has not noticed any blood in the stool or black stool denies using frequently any NSAIDs.  Creatinine also has increased from baseline.  Stool for occult blood was positive and is melanotic.  Patient admitted for acute GI bleed.  Shortness of breath likely from symptomatic anemia.  No signs of any myasthenia gravis exacerbation.  Review of Systems: As per HPI, rest all negative.   Past Medical History:  Diagnosis Date  . Chronic systolic congestive heart failure ([place])   . Depression   . DJD (degenerative joint disease)   . Implantable cardioverter-defibrillator lead failure    s/p lead revision in 2010 by Dr [name],  subsequent newly placed RV defibrillator lead has fractured.  ICD therapies programmed off 6/15  . Myasthenia gravis    mild  . Nonischemic cardiomyopathy (HCC)    EF improved from 20% to 50% with CRT  . Obesity   . Pickwickian syndrome ([place])   . Schwannoma    rested at [facility]    Past Surgical History:  Procedure Laterality Date  . BIV ICD GENERTAOR CHANGE OUT Left 05/23/2011   Procedure: BIV ICD GENERTAOR CHANGE OUT;   Surgeon: [name], MD;  Location: [facility] CATH LAB;  Service: Cardiovascular;  Laterality: Left;  . BIV PACEMAKER GENERATOR CHANGEOUT N/A 03/14/2017   Medtronic Percepta CRT-P MRI [place] model [id number] (SN [id number]) BiV pacemaker.  OF NOTE:  Due to partial failure of the MDT [phone] lead (RV shock/SVC shock coil fracture), I elected to place the pace/ sense portion of this lead into the LV port of the device.  The MDT 6440 was placed into the RV port on the device.  This will allow LV only pacing should the RV lead fail further.  The patient was   . CARDIAC DEFIBRILLATOR PLACEMENT  09/13/05, 12/07/2007, 05/23/11   MDT BiV ICD implanted by Dr [name], His Fidelis [id number] lead fractured and was replaced by Dr [name] 12/07/2007, Generator change by Dr [name] 05/23/11  . schwannoma resection     removed at [place]     reports that he has never smoked. He has never used smokeless tobacco. He reports that he does not drink alcohol or use drugs.  Allergies  Allergen Reactions  . Penicillins Hives, Rash and Other (See Comments)    Has patient had a PCN reaction causing immediate rash, facial/tongue/throat swelling, SOB or lightheadedness with hypotension:  Has patient had a PCN reaction causing severe rash involving mucus membranes or skin necrosis:  Has patient had a PCN reaction that required hospitalization:  Has patient had a PCN reaction occurring within the last 10 years:  If all of the above answers are "NO", then may proceed with  Cephalosporin use.     Family History  Problem Relation Age of Onset  . Lung disease Father   . Heart attack Neg Hx   . Stroke Neg Hx     Prior to Admission medications   Medication Sig Start Date End Date Taking? Authorizing Provider  carvedilol (COREG) 12.5 MG tablet Take 1 tablet (12.5 mg total) by mouth 2 (two) times daily. 02/23/18  Yes Jerline Pain, MD  Cholecalciferol (VITAMIN D3) 2000 UNITS TABS Take 1 tablet by mouth daily.   Yes [provider]   cyanocobalamin 100 MCG tablet Take 100 mcg by mouth daily.   Yes [provider]  cycloSPORINE modified (GENGRAF) 25 MG capsule Take 25 mg by mouth 2 (two) times daily.   Yes [provider]  docusate sodium (COLACE) 100 MG capsule Take 100 mg by mouth daily as needed for mild constipation.   Yes [provider]  losartan (COZAAR) 25 MG tablet Take 1 tablet (25 mg total) by mouth daily. 02/23/18  Yes Jerline Pain, MD  Multiple Vitamins-Minerals (MENS MULTI VITAMIN & MINERAL PO) Take 1 tablet by mouth daily.   Yes [provider]  aspirin 81 MG tablet Take 1 tablet (81 mg total) by mouth daily. Patient not taking: Reported on 07/23/2018 02/23/18   Jerline Pain, MD    Physical Exam: Vitals:   07/23/18 0345 07/23/18 0350 07/23/18 0417 07/23/18 0455  BP: 111/65  115/69 (!) 112/52  Pulse: 69  70 65  Resp: 13  18 16   Temp:  98.2 F (36.8 C) 98.4 F (36.9 C) 97.8 F (36.6 C)  TempSrc:   Oral   SpO2: 100%  100% 100%  Weight:      Height:          Constitutional: Moderately built and nourished. Vitals:   07/23/18 0345 07/23/18 0350 07/23/18 0417 07/23/18 0455  BP: 111/65  115/69 (!) 112/52  Pulse: 69  70 65  Resp: 13  18 16   Temp:  98.2 F (36.8 C) 98.4 F (36.9 C) 97.8 F (36.6 C)  TempSrc:   Oral   SpO2: 100%  100% 100%  Weight:      Height:       Eyes: Anicteric no pallor. ENMT: No discharge from the ears eyes nose or mouth. Neck: No mass felt.  No neck rigidity. Respiratory: No rhonchi or crepitations. Cardiovascular: S1-S2 heard. Abdomen: Soft nontender bowel sounds present. Musculoskeletal: No edema. Skin: Rash in the right lower extremity which patient states is chronic for last 2 months. Neurologic: Alert awake oriented to time place and person.  Moves all extremities. Psychiatric: Appears normal per normal affect.   Labs on Admission: I have personally reviewed following labs and imaging studies  CBC: Recent Labs  Lab  07/23/18 0022  WBC 10.2  NEUTROABS 9.3*  HGB 8.5*  HCT 26.3*  MCV 94.3  PLT 638   Basic Metabolic Panel: Recent Labs  Lab 07/23/18 0022  NA 131*  K 4.8  CL 102  CO2 19*  GLUCOSE 121*  BUN 28*  CREATININE 2.07*  CALCIUM 8.2*   GFR: Estimated Creatinine Clearance: 30.2 mL/min (A) (by C-G formula based on SCr of 2.07 mg/dL (H)). Liver Function Tests: No results for input(s): AST, ALT, ALKPHOS, BILITOT, PROT, ALBUMIN in the last 168 hours. No results for input(s): LIPASE, AMYLASE in the last 168 hours. No results for input(s): AMMONIA in the last 168 hours. Coagulation Profile: Recent Labs  Lab 07/23/18 0022  INR 1.3*   Cardiac Enzymes: No results for input(s): CKTOTAL, CKMB, CKMBINDEX, TROPONINI in the last 168 hours. BNP (last 3 results) No results for input(s): PROBNP in the last 8760 hours. HbA1C: No results for input(s): HGBA1C in the last 72 hours. CBG: No results for input(s): GLUCAP in the last 168 hours. Lipid Profile: No results for input(s): CHOL, HDL, LDLCALC, TRIG, CHOLHDL, LDLDIRECT in the last 72 hours. Thyroid Function Tests: No results for input(s): TSH, T4TOTAL, FREET4, T3FREE, THYROIDAB in the last 72 hours. Anemia Panel: No results for input(s): VITAMINB12, FOLATE, FERRITIN, TIBC, IRON, RETICCTPCT in the last 72 hours. Urine analysis:    Component Value Date/Time   COLORURINE YELLOW 03/01/2010 0929   APPEARANCEUR CLEAR 03/01/2010 0929   LABSPEC 1.021 03/01/2010 0929   PHURINE 7.0 03/01/2010 0929   HGBUR SMALL (A) 03/01/2010 0929   BILIRUBINUR NEGATIVE 03/01/2010 0929   KETONESUR >80 (A) 03/01/2010 0929   PROTEINUR NEGATIVE 03/01/2010 0929   UROBILINOGEN 2.0 (H) 03/01/2010 0929   NITRITE NEGATIVE 03/01/2010 0929   LEUKOCYTESUR NEGATIVE 03/01/2010 0929   Sepsis Labs: @LABRCNTIP (procalcitonin:4,lacticidven:4) ) Recent Results (from the past 240 hour(s))  SARS Coronavirus 2     Status: None   Collection Time: 07/23/18  3:07 AM  Result  Value Ref Range Status   SARS Coronavirus 2 NOT DETECTED NOT DETECTED Final    Comment: (NOTE) SARS-CoV-2 target nucleic acids are NOT DETECTED. The SARS-CoV-2 RNA is generally detectable in upper and lower respiratory specimens during the acute phase of infection.  Negative  results do not preclude SARS-CoV-2 infection, do not rule out co-infections with other pathogens, and should not be used as the sole basis for treatment or other patient management decisions.  Negative results must be combined with clinical observations, patient history, and epidemiological information. The expected result is Not Detected. Fact Sheet for Patients: http://www.biofiredefense.com/wp-content/uploads/2020/03/BIOFIRE-COVID -19-patients.pdf Fact Sheet for Healthcare Providers: http://www.biofiredefense.com/wp-content/uploads/2020/03/BIOFIRE-COVID -19-hcp.pdf This test is not yet approved or cleared by the [place] and  has been authorized for detection and/or diagnosis of SARS-CoV-2 by FDA under an Emergency Use Authorization (EUA).  This EUA will remain in effec t (meaning this test can be used) for the duration of  the COVID-19 declaration under Section 564(b)(1) of the Act, 21 U.S.C. section 360bbb-3(b)(1), unless the authorization is terminated or revoked sooner. Performed at [facility], [place] [address] [place], [place] [address]      Radiological Exams on Admission: Dg Chest Port 1 View  Result Date: 07/22/2018 CLINICAL DATA:  85 y/o  M; 2 days of shortness of breath. EXAM: PORTABLE CHEST 1 VIEW COMPARISON:  08/04/2013 chest radiograph FINDINGS: Stable enlarged cardiac silhouette given projection and technique. Three lead pacemaker. Aortic calcific atherosclerosis. No consolidation, effusion, or pneumothorax. No acute osseous abnormality is evident. Azygos fissure noted. IMPRESSION: No acute pulmonary process identified. Stable enlarged cardiac silhouette. Electronically Signed    By: Kristine Garbe M.D.   On: 07/22/2018 23:51    EKG: Independently reviewed.  Paced rhythm.   Assessment/Plan Principal Problem:   Acute GI bleeding Active Problems:   Systolic dysfunction with acute on chronic heart failure (HCC)   Implanted defibrillator electrode lead fracture   Myasthenia gravis, AChR antibody positive (HCC)   Acute blood loss anemia    1. Acute GI bleeding -since patient stool is melanotic could be upper GI bleed.  We will keep patient n.p.o. and on Protonix infusion.  Discussed with pharmacy about Protonix and mycinette compatibility which has per the  family she has no contraindication.  Follow CBC after transfusion.  Consult GI in the morning. 2. Acute blood loss anemia likely causing patient's shortness of breath.  Follow CBC after transfusion.  Patient looks euvolemic. 3. History of myasthenia gravis on cyclosporine which will be continued.  Discussed with Dr. Cheral Marker on-call neurologist.  At this time patient symptoms are likely from anemia.  Patient does not have any difficulty swallowing ptosis or any weakness of the extremities to suggest myasthenia gravis exacerbation.  If patient symptoms persist after transfusion may consider neurology consult for myasthenia.  For now we will check negative inspiratory force and vital capacity every 12 hours. 4. Chronic systolic heart failure status post CRT appears euvolemic.  Will hold Cozaar for now due to acute renal failure.  On Coreg. 5. Acute renal failure likely from worsening anemia.  Follow metabolic panel after holding Cozaar and transfusion.  May need further work-up on this as outpatient.   DVT prophylaxis: SCDs. Code Status: Full code.  Changed from initial. Family Communication: Discussed with patient. Disposition Plan: Home. Consults called: Discussed with neurologist. Admission status: Observation.   Rise Patience MD [facility] Pager [phone].  If 7PM-7AM, please contact  night-coverage [email or web address] Password [facility]  07/23/2018, 5:06 AM

## 2018-07-23 NOTE — Care Management Obs Status (Signed)
[name] NOTIFICATION   Patient Details  Name: Read Bonelli Aldana MRN: [id number] Date of Birth: [birth year]   Medicare Observation Status Notification Given:  Yes    Gerrianne Scale [place], LCSW 07/23/2018, 3:38 PM

## 2018-07-23 NOTE — H&P (View-Only) (Signed)
[facility] Consult: 1:58 PM 07/23/2018  LOS: 0 days    Referring Provider: Dr Avon Gully  Primary Care Physician:  [name], MD Primary Gastroenterologist:  Dr. Carlean Purl     Reason for Consultation: Anemia and tarry stools   HPI: [name] is a 85 y.o. male.  Hx Nonischemic cardiomyopathy, systolic CHF.  Pacemaker/ICD placed 2007, revision 2010, 2013, 2019.  Obesity.  Pickwickian syndrome.  Myasthenia gravis, on cyclosporine. Lap chole 2012  01/2003 colonoscopy.  Dr. Henrene Pastor.  For evaluation hematochezia.  Sigmoid diverticulosis.  Small polyp removed from hepatic flexure.  Internal hemorrhoids.  Polyp was tubular adenoma without HGD I do not see records of any subsequent colonoscopies and there are no upper endoscopies in epic.  Patient himself does not recall ever having had a colonoscopy.  For 1 month he has had anorexia and reduced p.o. intake.  He suspects weight loss of about 13 pounds.  No nausea or vomiting, no dysphagia, no abdominal pain.  About 2 weeks ago for a few days he saw tarry stools, they were black but no gross blood.  Then he did not have bowel movements for a few days but stools intermittently have been tarry, the last tarry stool was yesterday.  He felt dizzy, weak, dyspneic.  Admitted from the ED yesterday with symptomatic anemia, Hgb 8.5, was 14.41  six months prior.  He received 2 PRBC, no repeat Hgb yet. Worse renal function with BUN/creatinine 28/2.0 compared with 19/1.1 four months ago. GFR c/w stage 4 CKD.   FOBT +.   Patient does not drink alcohol.  81 mg aspirin daily.  Uses over-the-counter ibuprofen maybe 2 or 3 times a month at most. Family history negative for colorectal cancer, gastric cancer, stomach or intestinal problems, anemia.  Past Medical History:   Diagnosis Date  . Chronic systolic congestive heart failure ([place])   . Depression   . DJD (degenerative joint disease)   . Implantable cardioverter-defibrillator lead failure    s/p lead revision in 2010 by Dr [name],  subsequent newly placed RV defibrillator lead has fractured.  ICD therapies programmed off 6/15  . Myasthenia gravis    mild  . Nonischemic cardiomyopathy (HCC)    EF improved from 20% to 50% with CRT  . Obesity   . Pickwickian syndrome ([place])   . Schwannoma    rested at [facility]    Past Surgical History:  Procedure Laterality Date  . BIV ICD GENERTAOR CHANGE OUT Left 05/23/2011   Procedure: BIV ICD GENERTAOR CHANGE OUT;  Surgeon: [name], MD;  Location: [facility] CATH LAB;  Service: Cardiovascular;  Laterality: Left;  . BIV PACEMAKER GENERATOR CHANGEOUT N/A 03/14/2017   Medtronic Percepta CRT-P MRI [place] model [id number] (SN [id number]) BiV pacemaker.  OF NOTE:  Due to partial failure of the MDT [phone] lead (RV shock/SVC shock coil fracture), I elected to place the pace/ sense portion of this lead into the LV port of the device.  The MDT 1497 was placed into the RV port on the device.  This will allow LV only  pacing should the RV lead fail further.  The patient was   . CARDIAC DEFIBRILLATOR PLACEMENT  09/13/05, 12/07/2007, 05/23/11   MDT BiV ICD implanted by Dr [name], His Fidelis [phone] lead fractured and was replaced by Dr [name] 12/07/2007, Generator change by Dr [name] 05/23/11  . schwannoma resection     removed at [employer]    Prior to Admission medications   Medication Sig Start Date End Date Taking? Authorizing Provider  carvedilol (COREG) 12.5 MG tablet Take 1 tablet (12.5 mg total) by mouth 2 (two) times daily. 02/23/18  Yes Jerline Pain, MD  Cholecalciferol (VITAMIN D3) 2000 UNITS TABS Take 1 tablet by mouth daily.   Yes [provider]  cyanocobalamin 100 MCG tablet Take 100 mcg by mouth daily.   Yes [provider]  cycloSPORINE modified (GENGRAF) 25 MG  capsule Take 25 mg by mouth 2 (two) times daily.   Yes [provider]  docusate sodium (COLACE) 100 MG capsule Take 100 mg by mouth daily as needed for mild constipation.   Yes [provider]  losartan (COZAAR) 25 MG tablet Take 1 tablet (25 mg total) by mouth daily. 02/23/18  Yes Jerline Pain, MD  Multiple Vitamins-Minerals (MENS MULTI VITAMIN & MINERAL PO) Take 1 tablet by mouth daily.   Yes [provider]  aspirin 81 MG tablet Take 1 tablet (81 mg total) by mouth daily. Patient not taking: Reported on 07/23/2018 02/23/18   Jerline Pain, MD    Scheduled Meds: . carvedilol  12.5 mg Oral BID WC  . cycloSPORINE modified  25 mg Oral BID  . [START ON 07/26/2018] pantoprazole  40 mg Intravenous Q12H  . [START ON 07/24/2018] pneumococcal 23 valent vaccine  0.5 mL Intramuscular Tomorrow-1000   Infusions: . pantoprozole (PROTONIX) infusion 8 mg/hr (07/23/18 0749)   PRN Meds: acetaminophen **OR** acetaminophen   Allergies as of 07/22/2018 - Review Complete 07/22/2018  Allergen Reaction Noted  . Penicillins Hives, Rash, and Other (See Comments) 05/16/2011    Family History  Problem Relation Age of Onset  . Lung disease Father   . Heart attack Neg Hx   . Stroke Neg Hx     Social History   Socioeconomic History  . Marital status: Divorced    Spouse name: Not on file  . Number of children: Not on file  . Years of education: Not on file  . Highest education level: Not on file  Occupational History  . Not on file  Social Needs  . Financial resource strain: Not on file  . Food insecurity    Worry: Not on file    Inability: Not on file  . Transportation needs    Medical: Not on file    Non-medical: Not on file  Tobacco Use  . Smoking status: Never Smoker  . Smokeless tobacco: Never Used  Substance and Sexual Activity  . Alcohol use: No  . Drug use: No  . Sexual activity: Not on file  Lifestyle  . Physical activity    Days per week: Not on file     Minutes per session: Not on file  . Stress: Not on file  Relationships  . Social Herbalist on phone: Not on file    Gets together: Not on file    Attends religious service: Not on file    Active member of club or organization: Not on file    Attends meetings of clubs or organizations: Not on file  Relationship status: Not on file  . Intimate partner violence    Fear of current or ex partner: Not on file    Emotionally abused: Not on file    Physically abused: Not on file    Forced sexual activity: Not on file  Other Topics Concern  . Not on file  Social History Narrative  . Not on file    REVIEW OF SYSTEMS: Constitutional: Weakness, fatigue. ENT:  No nose bleeds Pulm: Smir on exertion.  Occasional nonpurulent cough.  Does not use supplemental oxygen or CPAP/BiPAP. CV:  No palpitations, no LE edema.  No angina.   GU:  No hematuria, no frequency GI: See HPI. Heme: No excessive or unusual bleeding or bruising. Transfusions: No prior transfusions.  Interestingly patient is not aware that he received 2 units of blood overnight, though he is oriented x3 for me today. Neuro:  No headaches, no peripheral tingling or numbness.  Dizziness, no syncope. Derm:  No itching, no rash or sores.  Endocrine:  No sweats or chills.  No polyuria or dysuria Immunization: Did not inquire as to recent or previous vaccination history. Travel:  None beyond local counties in last few months.    PHYSICAL EXAM: Vital signs in last 24 hours: Vitals:   07/23/18 0741 07/23/18 1238  BP: 105/60 (!) 105/56  Pulse: 63 (!) 58  Resp: 17 20  Temp: 97.7 F (36.5 C) (!) 97.5 F (36.4 C)  SpO2: 100% 100%   Wt Readings from Last 3 Encounters:  07/23/18 87.4 kg  07/22/18 90.7 kg  02/23/18 103.4 kg    General: Elderly, somewhat frail, alert, non-acutely ill-appearing, pale WM Head: No facial asymmetry or swelling.  No signs of head trauma. Eyes: No scleral icterus.  No conjunctival pallor.   EOMI. Ears: Not hard of hearing. Nose: No congestion or discharge Mouth: Moist, pink, clear mucosa.  Tongue midline.  Upper dentures. Neck: No JVD, no masses, no thyromegaly. Lungs: Clear bilaterally though overall reduced breath sounds.  Kyphotic spine.  No labored breathing.  No cough Heart: RRR.  No MRG.  S1, S2 present Abdomen: Soft.  Not tender or distended.  No HSM, masses, bruits, hernias..   Rectal: Deferred exam.  Stools submitted to the lab was FOBT positive Musc/Skeltl: No joint swelling, redness.  Spinal kyphosis. Extremities: No CCE. Neurologic: Oriented x3.  Alert.  Moves all 4 limbs, no tremor.  Limb strength not tested. Skin: No rash, no sores, no suspicious lesions, no telangiectasia Tattoos: None Nodes: Cervical adenopathy Psych: Cooperative, pleasant, fluid speech.  Intake/Output from previous day: 06/17 0701 - 06/18 0700 In: 958 [Blood:958] Out: 200 [Urine:200] Intake/Output this shift: Total I/O In: 362 [Blood:362] Out: -   LAB RESULTS: Recent Labs    07/23/18 0022  WBC 10.2  HGB 8.5*  HCT 26.3*  PLT 325   BMET Lab Results  Component Value Date   NA 131 (L) 07/23/2018   NA 141 03/14/2017   NA 136 06/20/2014   K 4.8 07/23/2018   K 4.2 03/14/2017   K 4.1 06/20/2014   CL 102 07/23/2018   CL 106 03/14/2017   CL 102 06/20/2014   CO2 19 (L) 07/23/2018   CO2 24 03/14/2017   CO2 28 06/20/2014   GLUCOSE 121 (H) 07/23/2018   GLUCOSE 83 03/14/2017   GLUCOSE 91 06/20/2014   BUN 28 (H) 07/23/2018   BUN 19 03/14/2017   BUN 31 (H) 06/20/2014   CREATININE 2.07 (H) 07/23/2018   CREATININE 1.11 03/14/2017  CREATININE 1.13 06/20/2014   CALCIUM 8.2 (L) 07/23/2018   CALCIUM 8.3 (L) 03/14/2017   CALCIUM 8.4 06/20/2014   LFT No results for input(s): PROT, ALBUMIN, AST, ALT, ALKPHOS, BILITOT, BILIDIR, IBILI in the last 72 hours. PT/INR Lab Results  Component Value Date   INR 1.3 (H) 07/23/2018   Hepatitis Panel No results for input(s): HEPBSAG,  HCVAB, HEPAIGM, HEPBIGM in the last 72 hours. C-Diff No components found for: CDIFF Lipase     Component Value Date/Time   LIPASE 14 03/01/2010 0937    Drugs of Abuse  No results found for: LABOPIA, COCAINSCRNUR, LABBENZ, AMPHETMU, THCU, LABBARB   RADIOLOGY STUDIES: Dg Chest Port 1 View  Result Date: 07/22/2018 CLINICAL DATA:  85 y/o  M; 2 days of shortness of breath. EXAM: PORTABLE CHEST 1 VIEW COMPARISON:  08/04/2013 chest radiograph FINDINGS: Stable enlarged cardiac silhouette given projection and technique. Three lead pacemaker. Aortic calcific atherosclerosis. No consolidation, effusion, or pneumothorax. No acute osseous abnormality is evident. Azygos fissure noted. IMPRESSION: No acute pulmonary process identified. Stable enlarged cardiac silhouette. Electronically Signed   By: Kristine Garbe M.D.   On: 07/22/2018 23:51      IMPRESSION:   *    Anemia, normocytic.  No iron, B12, folate studies thus far.  Received PRBC x2 Symptoms of weakness, dizziness, shortness of breath improved following transfusion.  *    Intermittent tarry stool, FOBT positive.  Only GI symptom is anorexia and irregular stools associated with the decreased p.o. intake in the last month.  Has lost about 13 pounds as well.  *   Adenomatous colon polyp 2004, has not had follow-up colonoscopy since then.    PLAN:     *    EGD, patient has been drinking apple juice within the last 30 minutes so we will probably need to delay this until tomorrow.  Dr. Fuller Plan may want to pursue colonoscopy as well given the polyp history but this to be determined.   *   For now, continue with Protonix 40 mg IV twice daily, stop Protonix drip  Azucena Freed  07/23/2018, 1:58 PM Phone [phone]     Attending Physician Note   I have taken a history, examined the patient and reviewed the chart. I agree with the Advanced Practitioner's note, impression and recommendations.  * Anemia with intermittent melena.  R/O ulcer, gastritis, AVMs, etc.  * History of adenomatous colon polyps.   * Protonxi 40 mg IV q12h * EGD tomorrow at 0900  * Given his age no plans for colonoscopy for polyp surveillance however may need to pursue colonoscopy to further investigate bleeding source if EGD is not diagnostic  Lucio Edward, MD [facility] [phone]

## 2018-07-24 ENCOUNTER — Encounter (HOSPITAL_COMMUNITY): Payer: Self-pay

## 2018-07-24 ENCOUNTER — Inpatient Hospital Stay (HOSPITAL_COMMUNITY): Payer: Medicare Other | Admitting: Certified Registered"

## 2018-07-24 ENCOUNTER — Encounter (HOSPITAL_COMMUNITY)
Admission: EM | Disposition: A | Discharge status: Home / Self Care | Payer: Self-pay | Source: Home / Self Care | Attending: Internal Medicine

## 2018-07-24 DIAGNOSIS — K264 Chronic or unspecified duodenal ulcer with hemorrhage: Secondary | ICD-10-CM

## 2018-07-24 HISTORY — PX: ESOPHAGOGASTRODUODENOSCOPY (EGD) WITH PROPOFOL: SHX5813

## 2018-07-24 HISTORY — PX: BIOPSY: SHX5522

## 2018-07-24 LAB — BASIC METABOLIC PANEL
Anion gap: 8 (ref 5–15)
BUN: 25 mg/dL — ABNORMAL HIGH (ref 8–23)
CO2: 19 mmol/L — ABNORMAL LOW (ref 22–32)
Calcium: 7.9 mg/dL — ABNORMAL LOW (ref 8.9–10.3)
Chloride: 107 mmol/L (ref 98–111)
Creatinine, Ser: 1.65 mg/dL — ABNORMAL HIGH (ref 0.61–1.24)
GFR calc Af Amer: 44 mL/min — ABNORMAL LOW (ref >60)
GFR calc non Af Amer: 38 mL/min — ABNORMAL LOW (ref >60)
Glucose, Bld: 92 mg/dL (ref 70–99)
Potassium: 3.8 mmol/L (ref 3.5–5.1)
Sodium: 134 mmol/L — ABNORMAL LOW (ref 135–145)

## 2018-07-24 LAB — TYPE AND SCREEN
ABO/RH(D): O POS
Antibody Screen: NEGATIVE
Unit division: 0
Unit division: 0

## 2018-07-24 LAB — BPAM RBC
Blood Product Expiration Date: 202007182359
Blood Product Expiration Date: 202007182359
ISSUE DATE / TIME: 202006180131
ISSUE DATE / TIME: 202006180432
Unit Type and Rh: 5100
Unit Type and Rh: 5100

## 2018-07-24 LAB — CBC
HCT: 29.1 % — ABNORMAL LOW (ref 39.0–52.0)
Hemoglobin: 9.6 g/dL — ABNORMAL LOW (ref 13.0–17.0)
MCH: 30.7 pg (ref 26.0–34.0)
MCHC: 33 g/dL (ref 30.0–36.0)
MCV: 93 fL (ref 80.0–100.0)
Platelets: 262 10*3/uL (ref 150–400)
RBC: 3.13 MIL/uL — ABNORMAL LOW (ref 4.22–5.81)
RDW: 14.4 % (ref 11.5–15.5)
WBC: 9.1 10*3/uL (ref 4.0–10.5)
nRBC: 0 % (ref 0.0–0.2)

## 2018-07-24 LAB — GLUCOSE, CAPILLARY
Glucose-Capillary: 130 mg/dL — ABNORMAL HIGH (ref 70–99)
Glucose-Capillary: 157 mg/dL — ABNORMAL HIGH (ref 70–99)
Glucose-Capillary: 64 mg/dL — ABNORMAL LOW (ref 70–99)
Glucose-Capillary: 67 mg/dL — ABNORMAL LOW (ref 70–99)
Glucose-Capillary: 81 mg/dL (ref 70–99)

## 2018-07-24 SURGERY — ESOPHAGOGASTRODUODENOSCOPY (EGD) WITH PROPOFOL
Anesthesia: Monitor Anesthesia Care

## 2018-07-24 MED ORDER — PANTOPRAZOLE SODIUM 40 MG PO TBEC: 40.0000 mg | DELAYED_RELEASE_TABLET | Freq: Two times a day (BID) | ORAL | 0 refills | Status: DC

## 2018-07-24 MED ORDER — DEXTROSE 50 % IV SOLN
INTRAVENOUS | Status: AC
  Administered 2018-07-24: 25 mL
  Filled 2018-07-24: qty 50

## 2018-07-24 MED ORDER — DEXTROSE-NACL 5-0.9 % IV SOLN
INTRAVENOUS | Status: DC
  Administered 2018-07-24: 05:00:00 via INTRAVENOUS

## 2018-07-24 MED ORDER — PROPOFOL 500 MG/50ML IV EMUL
INTRAVENOUS | Status: DC | PRN
  Administered 2018-07-24: 50 ug/kg/min via INTRAVENOUS

## 2018-07-24 SURGICAL SUPPLY — 15 items

## 2018-07-24 NOTE — Anesthesia Procedure Notes (Signed)
Date/Time: 07/24/2018 9:48 AM Performed by: Barrington Ellison, CRNA Pre-anesthesia Checklist: Patient identified, Emergency Drugs available, Suction available and Patient being monitored Patient Re-evaluated:Patient Re-evaluated prior to induction Oxygen Delivery Method: Nasal cannula

## 2018-07-24 NOTE — Progress Notes (Signed)
NIF = - 40 cm H20 VC = 2.8L

## 2018-07-24 NOTE — Anesthesia Preprocedure Evaluation (Addendum)
Anesthesia Evaluation  Patient identified by MRN, date of birth, ID band Patient awake    Reviewed: Allergy & Precautions, NPO status , Patient's Chart, lab work & pertinent test results  Airway Mallampati: II  TM Distance: >3 FB Neck ROM: Full    Dental  (+) Dental Advisory Given, Missing,    Pulmonary neg pulmonary ROS,    Pulmonary exam normal breath sounds clear to auscultation       Cardiovascular +CHF  Normal cardiovascular exam+ Cardiac Defibrillator  Rhythm:Regular Rate:Normal     Neuro/Psych PSYCHIATRIC DISORDERS Depression MG  Neuromuscular disease    GI/Hepatic negative GI ROS, Neg liver ROS,   Endo/Other  negative endocrine ROS  Renal/GU Renal InsufficiencyRenal disease     Musculoskeletal  (+) Arthritis ,   Abdominal   Peds  Hematology  (+) Blood dyscrasia, anemia ,   Anesthesia Other Findings Day of surgery medications reviewed with the patient.  Reproductive/Obstetrics                           Anesthesia Physical Anesthesia Plan  ASA: III  Anesthesia Plan: MAC   Post-op Pain Management:    Induction: Intravenous  PONV Risk Score and Plan: 1 and Propofol infusion and Treatment may vary due to age or medical condition  Airway Management Planned: Nasal Cannula  Additional Equipment:   Intra-op Plan:   Post-operative Plan:   Informed Consent: I have reviewed the patients History and Physical, chart, labs and discussed the procedure including the risks, benefits and alternatives for the proposed anesthesia with the patient or authorized representative who has indicated his/her understanding and acceptance.     Dental advisory given  Plan Discussed with: CRNA and Anesthesiologist  Anesthesia Plan Comments:         Anesthesia Quick Evaluation

## 2018-07-24 NOTE — Transfer of Care (Signed)
Immediate Anesthesia Transfer of Care Note  Patient: [name]  Procedure(s) Performed: ESOPHAGOGASTRODUODENOSCOPY (EGD) WITH PROPOFOL (N/A ) BIOPSY  Patient Location: Endoscopy Unit  Anesthesia Type:MAC  Level of Consciousness: drowsy and patient cooperative  Airway & Oxygen Therapy: Patient Spontanous Breathing  Post-op Assessment: Report given to RN  Post vital signs: Reviewed and stable  Last Vitals:  Vitals Value Taken Time  BP    Temp    Pulse    Resp    SpO2      Last Pain:  Vitals:   07/24/18 0902  TempSrc: Temporal  PainSc: 0-No pain      Patients Stated Pain Goal: 0 (20/80/22 3361)  Complications: No apparent anesthesia complications

## 2018-07-24 NOTE — Progress Notes (Signed)
Patient has had two hypoglycemia episodes this shift, treated with 35mL of Dextrose IV push each time due to patient being NPO for procedure today.  RN text paged [facility] with this information.

## 2018-07-24 NOTE — Discharge Summary (Signed)
Physician Discharge Summary  [name] HGD:[id number] DOB: [birth year] DOA: 07/22/2018  PCP: [name], MD  Admit date: 07/22/2018 Discharge date: 07/24/2018  Admitted From: Home Disposition:  Home  Recommendations for Outpatient Follow-up:  1. Follow up with PCP in 1-2 weeks 2. Please obtain BMP/CBC in one week  Home Health: No  Equipment/Devices: None  Discharge Condition: Stable CODE STATUS: Full Diet recommendation: Soft diet for 1 week, then resume regular diet  Brief/Interim Summary: Shaun A Blomeieris a 85 y.o.malewithhistory of nonischemic cardiomyopathy status post pacemaker placement, myasthenia gravis pickwickian syndrome presents to the ER with complaint of increasing shortness of breath ongoing for last 1 week. Shortness of breath is mostly exertional. Denies any chest pain fever chills productive cough nausea vomiting diarrhea peer denies any difficulty speaking swallowing any ptosis or extremity weakness.In the ER patient was hemodynamically stable chest x-ray unremarkable EKG shows paced rhythm. Patient's blood work showed hemoglobin of 8.5 and creatinine of 2. Hemoglobin last was 14.4 almost a year ago. Patient has not noticed any blood in the stool or black stool denies using frequently any NSAIDs. Creatinine also has increased from baseline. Stool for occult blood was positive and is melanotic. Patient admitted for acute GI bleed. Shortness of breath likely from symptomatic anemia. No signs of any myasthenia gravis exacerbation.  Patient admitted as above for acute symptomatic bleeding, concerning for GI bleed given dark melanotic stools over the past few weeks.  Patient had upper EGD today on the 19th with GI showing stable duodenal ulcer and esophageal ulcer without bleeding.  Patient's stable hemoglobin, likely resolving GI bleed patient is otherwise stable and agreeable for discharge home.  GI recommends cessation of aspirin and NSAIDs until  outpatient follow-up.  Soft diet continued as above for 1 week.  We will continue patient on PPI per recommendations as well.   Discharge Diagnoses:  Principal Problem:   Acute GI bleeding Active Problems:   Systolic dysfunction with acute on chronic heart failure (HCC)   Implanted defibrillator electrode lead fracture   Myasthenia gravis, AChR antibody positive (HCC)   Acute blood loss anemia  Acute GI bleed, unclear source, POA, resolved GI asked to follow, patient had previous scope with [place] in 2004 1 unit PRBC administered at admission - symptoms resolved soon after Continue PPI, soft diet per GI  Acute symptomatic blood loss anemia 2/2 above,POA Status post transfusion, now asymptomatic, follow clinically  Repeat CBC with morning labs stable  AKI, without history CKD, resolving Likely in the setting of symptomatic anemia as above, follow with morning labs  Myasthenia Gravis Continue home medications with cyclosporine,  Chronic systolic HF Currently euvolemic, home medications including carvedilol  Discharge Instructions  Follow up with PCP, GI as scheduled.  Continue all medications as directed, continue soft diet for 1 week, then resume regular diet.  Allergies as of 07/24/2018      Reactions   Penicillins Hives, Rash, Other (See Comments)   Has patient had a PCN reaction causing immediate rash, facial/tongue/throat swelling, SOB or lightheadedness with hypotension:  Has patient had a PCN reaction causing severe rash involving mucus membranes or skin necrosis:  Has patient had a PCN reaction that required hospitalization:  Has patient had a PCN reaction occurring within the last 10 years:  If all of the above answers are "NO", then may proceed with Cephalosporin use.      Medication List    STOP taking these medications   aspirin 81 MG tablet  TAKE these medications   carvedilol 12.5 MG tablet Commonly known as: COREG Take 1 tablet (12.5 mg total) by  mouth 2 (two) times daily.   Colace 100 MG capsule Generic drug: docusate sodium Take 100 mg by mouth daily as needed for mild constipation.   cyanocobalamin 100 MCG tablet Take 100 mcg by mouth daily.   Gengraf 25 MG capsule Generic drug: cycloSPORINE modified Take 25 mg by mouth 2 (two) times daily.   losartan 25 MG tablet Commonly known as: COZAAR Take 1 tablet (25 mg total) by mouth daily.   MENS MULTI VITAMIN & MINERAL PO Take 1 tablet by mouth daily.   pantoprazole 40 MG tablet Commonly known as: Protonix Take 1 tablet (40 mg total) by mouth 2 (two) times daily for 30 days.   Vitamin D3 50 MCG (2000 UT) Tabs Take 1 tablet by mouth daily.      Follow-up Information    Gatha Mayer, MD Follow up on 08/27/2018.   Specialty: Gastroenterology Why: 9 AM visit with GI doctor's office.  will be a "phone visit".  for ulcer follow up Contact information: [address] [address] [phone]          Allergies  Allergen Reactions  . Penicillins Hives, Rash and Other (See Comments)    Has patient had a PCN reaction causing immediate rash, facial/tongue/throat swelling, SOB or lightheadedness with hypotension:  Has patient had a PCN reaction causing severe rash involving mucus membranes or skin necrosis:  Has patient had a PCN reaction that required hospitalization:  Has patient had a PCN reaction occurring within the last 10 years:  If all of the above answers are "NO", then may proceed with Cephalosporin use.     Consultations:  [facility]  Procedures/Studies: Dg Chest Port 1 View  Result Date: 07/22/2018 CLINICAL DATA:  85 y/o  M; 2 days of shortness of breath. EXAM: PORTABLE CHEST 1 VIEW COMPARISON:  08/04/2013 chest radiograph FINDINGS: Stable enlarged cardiac silhouette given projection and technique. Three lead pacemaker. Aortic calcific atherosclerosis. No consolidation, effusion, or pneumothorax. No acute osseous abnormality is evident.  Azygos fissure noted. IMPRESSION: No acute pulmonary process identified. Stable enlarged cardiac silhouette. Electronically Signed   By: Kristine Garbe M.D.   On: 07/22/2018 23:51    Subjective: No acute events or issues overnight - tolerated procedure well. Requesting discharge given resolution of symptoms which is entirely reasonable.  Discharge Exam: Vitals:   07/24/18 1030 07/24/18 1240  BP: (!) 108/50 (!) 121/59  Pulse: (!) 59 63  Resp: 11   Temp:  (!) 97.4 F (36.3 C)  SpO2: 100% 100%   Vitals:   07/24/18 1011 07/24/18 1020 07/24/18 1030 07/24/18 1240  BP: (!) 104/56 (!) 93/40 (!) 108/50 (!) 121/59  Pulse: (!) 59 64 (!) 59 63  Resp: 14 17 11    Temp: 97.8 F (36.6 C)   (!) 97.4 F (36.3 C)  TempSrc: Oral   Oral  SpO2: 100% 100% 100% 100%  Weight:      Height:        General:  Pleasantly resting in bed, No acute distress. HEENT:  Normocephalic atraumatic.  Sclerae nonicteric, noninjected.  Extraocular movements intact bilaterally. Neck:  Without mass or deformity.  Trachea is midline. Lungs:  Clear to auscultate bilaterally without rhonchi, wheeze, or rales. Heart:  Regular rate and rhythm.  Without murmurs, rubs, or gallops. Abdomen:  Soft, nontender, nondistended.  Without guarding or rebound. Extremities: Without cyanosis, clubbing,  edema, or obvious deformity. Vascular:  Dorsalis pedis and posterior tibial pulses palpable bilaterally. Skin:  Warm and dry, no erythema, no ulcerations.  The results of significant diagnostics from this hospitalization (including imaging, microbiology, ancillary and laboratory) are listed below for reference.    Microbiology: Recent Results (from the past 240 hour(s))  SARS Coronavirus 2     Status: None   Collection Time: 07/23/18  3:07 AM  Result Value Ref Range Status   SARS Coronavirus 2 NOT DETECTED NOT DETECTED Final    Comment: (NOTE) SARS-CoV-2 target nucleic acids are NOT DETECTED. The SARS-CoV-2 RNA is  generally detectable in upper and lower respiratory specimens during the acute phase of infection.  Negative  results do not preclude SARS-CoV-2 infection, do not rule out co-infections with other pathogens, and should not be used as the sole basis for treatment or other patient management decisions.  Negative results must be combined with clinical observations, patient history, and epidemiological information. The expected result is Not Detected. Fact Sheet for Patients: http://www.biofiredefense.com/wp-content/uploads/2020/03/BIOFIRE-COVID -19-patients.pdf Fact Sheet for Healthcare Providers: http://www.biofiredefense.com/wp-content/uploads/2020/03/BIOFIRE-COVID -19-hcp.pdf This test is not yet approved or cleared by the [place] and  has been authorized for detection and/or diagnosis of SARS-CoV-2 by FDA under an Emergency Use Authorization (EUA).  This EUA will remain in effec t (meaning this test can be used) for the duration of  the COVID-19 declaration under Section 564(b)(1) of the Act, 21 U.S.C. section 360bbb-3(b)(1), unless the authorization is terminated or revoked sooner. Performed at [facility], [place] [address] [place], [place] [address]     Labs: BNP (last 3 results) No results for input(s): BNP in the last 8760 hours. Basic Metabolic Panel: Recent Labs  Lab 07/23/18 0022 07/24/18 0640  NA 131* 134*  K 4.8 3.8  CL 102 107  CO2 19* 19*  GLUCOSE 121* 92  BUN 28* 25*  CREATININE 2.07* 1.65*  CALCIUM 8.2* 7.9*   Liver Function Tests: No results for input(s): AST, ALT, ALKPHOS, BILITOT, PROT, ALBUMIN in the last 168 hours. No results for input(s): LIPASE, AMYLASE in the last 168 hours. No results for input(s): AMMONIA in the last 168 hours. CBC: Recent Labs  Lab 07/23/18 0022 07/23/18 1451 07/24/18 0640  WBC 10.2  --  9.1  NEUTROABS 9.3*  --   --   HGB 8.5* 9.6* 9.6*  HCT 26.3* 29.2* 29.1*  MCV 94.3  --  93.0  PLT 325  --  262    Cardiac Enzymes: No results for input(s): CKTOTAL, CKMB, CKMBINDEX, TROPONINI in the last 168 hours. BNP: Invalid input(s): POCBNP CBG: Recent Labs  Lab 07/23/18 1534 07/24/18 0016 07/24/18 0046 07/24/18 0501 07/24/18 0530  GLUCAP 88 64* 157* 67* 130*   D-Dimer No results for input(s): DDIMER in the last 72 hours. Hgb A1c No results for input(s): HGBA1C in the last 72 hours. Lipid Profile No results for input(s): CHOL, HDL, LDLCALC, TRIG, CHOLHDL, LDLDIRECT in the last 72 hours. Thyroid function studies No results for input(s): TSH, T4TOTAL, T3FREE, THYROIDAB in the last 72 hours.  Invalid input(s): FREET3 Anemia work up No results for input(s): VITAMINB12, FOLATE, FERRITIN, TIBC, IRON, RETICCTPCT in the last 72 hours. Urinalysis    Component Value Date/Time   COLORURINE YELLOW 03/01/2010 0929   APPEARANCEUR CLEAR 03/01/2010 0929   LABSPEC 1.021 03/01/2010 0929   PHURINE 7.0 03/01/2010 0929   HGBUR SMALL (A) 03/01/2010 0929   BILIRUBINUR NEGATIVE 03/01/2010 0929   KETONESUR >80 (A) 03/01/2010 0175  PROTEINUR NEGATIVE 03/01/2010 0929   UROBILINOGEN 2.0 (H) 03/01/2010 0929   NITRITE NEGATIVE 03/01/2010 0929   LEUKOCYTESUR NEGATIVE 03/01/2010 0929   Sepsis Labs Invalid input(s): PROCALCITONIN,  WBC,  LACTICIDVEN Microbiology Recent Results (from the past 240 hour(s))  SARS Coronavirus 2     Status: None   Collection Time: 07/23/18  3:07 AM  Result Value Ref Range Status   SARS Coronavirus 2 NOT DETECTED NOT DETECTED Final    Comment: (NOTE) SARS-CoV-2 target nucleic acids are NOT DETECTED. The SARS-CoV-2 RNA is generally detectable in upper and lower respiratory specimens during the acute phase of infection.  Negative  results do not preclude SARS-CoV-2 infection, do not rule out co-infections with other pathogens, and should not be used as the sole basis for treatment or other patient management decisions.  Negative results must be combined with clinical  observations, patient history, and epidemiological information. The expected result is Not Detected. Fact Sheet for Patients: http://www.biofiredefense.com/wp-content/uploads/2020/03/BIOFIRE-COVID -19-patients.pdf Fact Sheet for Healthcare Providers: http://www.biofiredefense.com/wp-content/uploads/2020/03/BIOFIRE-COVID -19-hcp.pdf This test is not yet approved or cleared by the [place] and  has been authorized for detection and/or diagnosis of SARS-CoV-2 by FDA under an Emergency Use Authorization (EUA).  This EUA will remain in effec t (meaning this test can be used) for the duration of  the COVID-19 declaration under Section 564(b)(1) of the Act, 21 U.S.C. section 360bbb-3(b)(1), unless the authorization is terminated or revoked sooner. Performed at [facility], [place] [address] [place], [place] [address]      Time coordinating discharge: Over 30 minutes  SIGNED:  Little Ishikawa, DO [facility] 07/24/2018, 2:13 PM Pager   If 7PM-7AM, please contact night-coverage [email or web address] Password [email or web address]

## 2018-07-24 NOTE — Anesthesia Postprocedure Evaluation (Signed)
Anesthesia Post Note  Patient: [name]  Procedure(s) Performed: ESOPHAGOGASTRODUODENOSCOPY (EGD) WITH PROPOFOL (N/A ) BIOPSY     Patient location during evaluation: Endoscopy Anesthesia Type: MAC Level of consciousness: awake and alert Pain management: pain level controlled Vital Signs Assessment: post-procedure vital signs reviewed and stable Respiratory status: spontaneous breathing, nonlabored ventilation, respiratory function stable and patient connected to nasal cannula oxygen Cardiovascular status: stable and blood pressure returned to baseline Postop Assessment: no apparent nausea or vomiting Anesthetic complications: no    Last Vitals:  Vitals:   07/24/18 0902 07/24/18 1011  BP: 132/74 (!) 104/56  Pulse: 62 (!) 59  Resp: 14 14  Temp: (!) 36.3 C 36.6 C  SpO2: 100% 100%    Last Pain:  Vitals:   07/24/18 1011  TempSrc: Oral  PainSc: 0-No pain                 Catalina Gravel

## 2018-07-24 NOTE — Op Note (Signed)
[facility] Patient Name: [name] Procedure Date : 07/24/2018 MRN: [id number] Attending MD: Estill Cotta. Loletha Carrow , MD Date of Birth: [birth year] CSN: [id number] Age: 85 Admit Type: Inpatient Procedure:                Upper GI endoscopy Indications:              Acute post hemorrhagic anemia, Melena, Weight loss Providers:                Mallie Mussel L. Loletha Carrow, MD, Raynelle Bring, RN, Ladona Ridgel, Technician Referring MD:             [facility] Medicines:                Monitored Anesthesia Care Complications:            No immediate complications. Estimated Blood Loss:     Estimated blood loss was minimal. Procedure:                Pre-Anesthesia Assessment:                           - Prior to the procedure, a History and Physical                            was performed, and patient medications and                            allergies were reviewed. The patient's tolerance of                            previous anesthesia was also reviewed. The risks                            and benefits of the procedure and the sedation                            options and risks were discussed with the patient.                            All questions were answered, and informed consent                            was obtained. Prior Anticoagulants: The patient has                            taken no previous anticoagulant or antiplatelet                            agents except for aspirin. ASA Grade Assessment:                            III - A patient with severe systemic disease. After  reviewing the risks and benefits, the patient was                            deemed in satisfactory condition to undergo the                            procedure.                           After obtaining informed consent, the endoscope was                            passed under direct vision. Throughout the   procedure, the patient's blood pressure, pulse, and                            oxygen saturations were monitored continuously. The                            GIF-H190 ([id number]) Olympus gastroscope was                            introduced through the mouth, and advanced to the                            second part of duodenum. The upper GI endoscopy was                            accomplished without difficulty. The patient                            tolerated the procedure well. Scope In: Scope Out: Findings:      One cratered esophageal ulcer with no bleeding and no stigmata of recent       bleeding was found at the gastroesophageal junction. The lesion was 10       mm in largest dimension.      One benign-appearing, intrinsic mild stenosis was found at the       gastroesophageal junction. This stenosis measured 1 cm (in length).      A medium-sized hiatal hernia was present (with both sliding and       paraesophageal components).      The stomach was normal. Biopsies were taken with a cold forceps for       histology. ([facility] protocol).      The cardia and gastric fundus were normal on retroflexion.      One non-bleeding cratered duodenal ulcer with pigmented material was       found in the duodenal bulb. The lesion was 12-15 mm in largest dimension.      Multiple non-bleeding cratered and superficial duodenal ulcers with no       stigmata of bleeding were found in the duodenal bulb, sweep and second       portion. Impression:               - Non-bleeding esophageal ulcer.                           -  Benign-appearing esophageal stenosis.                           - Medium-sized hiatal hernia.                           - Normal stomach. Biopsied.                           - Non-bleeding duodenal ulcer with pigmented                            material.                           - Non-bleeding duodenal ulcers with no stigmata of                            bleeding. Recommendation:            - Return patient to hospital ward for ongoing care.                            If no overt rebleeding by tomorrow AM likely                            discharge home. GI will follow patient tomorrow.                           - Soft diet for 1 week. Then resume regular diet                           - Continue present medications. Pantoprazole 40 IV                            twice daily until discharge, then change to 40 mg                            by mouth twice daily for 8 weeks.                           -DISCONTINUE ASPIRIN and NSAIDs (if any).                            Outpatient decision re: if/when to resume aspirin                            depends on patient clinical progress.                           - Await pathology results and treat if H. pylori                            positive. Procedure Code(s):        --- Professional ---  83338, Esophagogastroduodenoscopy, flexible,                            transoral; with biopsy, single or multiple Diagnosis Code(s):        --- Professional ---                           K22.10, Ulcer of esophagus without bleeding                           K22.2, Esophageal obstruction                           K44.9, Diaphragmatic hernia without obstruction or                            gangrene                           K26.9, Duodenal ulcer, unspecified as acute or                            chronic, without hemorrhage or perforation                           D62, Acute posthemorrhagic anemia                           K92.1, Melena (includes Hematochezia)                           R63.4, Abnormal weight loss CPT copyright 2019 American Medical Association. All rights reserved. The codes documented in this report are preliminary and upon coder review may  be revised to meet current compliance requirements. Henry L. Loletha Carrow, MD 07/24/2018 10:21:34 AM This report has been signed electronically. Number of  Addenda: 0

## 2018-07-24 NOTE — Interval H&P Note (Signed)
History and Physical Interval Note:  07/24/2018 9:46 AM  [name]  has presented today for surgery, with the diagnosis of Anemia, tarry stool, FOBT positive, anorexia, weight loss.  The various methods of treatment have been discussed with the patient and family. After consideration of risks, benefits and other options for treatment, the patient has consented to  Procedure(s): ESOPHAGOGASTRODUODENOSCOPY (EGD) WITH PROPOFOL (N/A) as a surgical intervention.  The patient's history has been reviewed, patient examined, no change in status, stable for surgery.  I have reviewed the patient's chart and labs.  Questions were answered to the patient's satisfaction.    Breathing comfortably. Alert and conversational.  Nelida Meuse III

## 2018-07-26 ENCOUNTER — Encounter (HOSPITAL_COMMUNITY): Payer: Self-pay | Admitting: Gastroenterology

## 2018-07-27 ENCOUNTER — Telehealth: Payer: Self-pay | Admitting: Gastroenterology

## 2018-07-27 ENCOUNTER — Encounter: Payer: Self-pay | Admitting: Gastroenterology

## 2018-07-27 NOTE — Telephone Encounter (Signed)
Family had questions regarding pts instructions following EGD that was done last week. Reviewed report over the phone, path report not back yet. Family aware of follow-up OV appt.

## 2018-08-27 ENCOUNTER — Encounter: Payer: Self-pay | Admitting: Internal Medicine

## 2018-08-27 ENCOUNTER — Ambulatory Visit (INDEPENDENT_AMBULATORY_CARE_PROVIDER_SITE_OTHER): Payer: Medicare Other | Admitting: Internal Medicine

## 2018-08-27 VITALS — Ht 71.0 in | Wt 220.0 lb

## 2018-08-27 DIAGNOSIS — T39395A Adverse effect of other nonsteroidal anti-inflammatory drugs [NSAID], initial encounter: Secondary | ICD-10-CM | POA: Diagnosis not present

## 2018-08-27 DIAGNOSIS — K269 Duodenal ulcer, unspecified as acute or chronic, without hemorrhage or perforation: Secondary | ICD-10-CM | POA: Diagnosis not present

## 2018-08-27 DIAGNOSIS — D62 Acute posthemorrhagic anemia: Secondary | ICD-10-CM | POA: Diagnosis not present

## 2018-08-27 DIAGNOSIS — K2211 Ulcer of esophagus with bleeding: Secondary | ICD-10-CM

## 2018-08-27 HISTORY — DX: Duodenal ulcer, unspecified as acute or chronic, without hemorrhage or perforation: K26.9

## 2018-08-27 HISTORY — DX: Ulcer of esophagus with bleeding: K22.11

## 2018-08-27 HISTORY — DX: Duodenal ulcer, unspecified as acute or chronic, without hemorrhage or perforation: T39.395A

## 2018-08-27 MED ORDER — PANTOPRAZOLE SODIUM 40 MG PO TBEC: 40.0000 mg | DELAYED_RELEASE_TABLET | Freq: Every day | ORAL | 3 refills | Status: AC

## 2018-08-27 NOTE — Progress Notes (Signed)
TELEHEALTH ENCOUNTER IN SETTING OF COVID-19 PANDEMIC - REQUESTED BY PATIENT SERVICE PROVIDED BY TELEMEDECINE - TYPE:telephone PATIENT LOCATION: [place]  PATIENT HAS CONSENTED TO TELEHEALTH VISIT PROVIDER LOCATION: OFFICE REFERRING PROVIDER:N/A PARTICIPANTS OTHER THAN PATIENT:Son, [name] TIME SPENT ON CALL:8 mins 43 sec   [name] 85 y.o. Apr 17, 1933 [id number]  Assessment & Plan:   Encounter Diagnoses  Name Primary?  . NSAID-induced duodenal ulcer with hemorrhage Yes  . Acute blood loss anemia   . Ulcer of esophagus with bleeding    I think he should stay on a PPI chronically both because of the history of duodenal ulcer and hemorrhage and his GERD and the esophageal ulcer.  I will prescribe a daily PPI and whomever he chooses for primary care in [place] can continue that.  We will mail copies of today's note his discharge summary and the endoscopy report to the patient.  If he returns here to [facility] area I am happy to care for him but it sounds like he will be staying in [place].  I have also advised to take ferrous sulfate 325 mg daily until he knows his hemoglobin and iron stores are back to normal, a primary care provider can check up on that.  I appreciate the opportunity to care for this patient.    Subjective:   Chief Complaint: Follow-up of GI bleeding from duodenal ulcer  HPI This man got into trouble with bleeding when he took NSAIDs for a cellulitis so the ulcer in the duodenum was the most likely cause of his bleeding but he had a small esophageal ulcer as well.  Other small duodenal ulcers also but there was one with stigmata of bleeding that was the most likely culprit.  He has a long history of GERD.  He is much better on PPI therapy.  He has moved to the [place] area to be near his son.  He has had follow-up hemoglobin so we do not know those results he is not on iron he is about to run out of his twice daily PPI.  No further signs of  melena or bleeding.  He has had a hemoglobin checked here in [place] and then up in [place].  Both his aspirin and NSAIDs have been stopped.  He does have a cardiac history as outlined in the past medical history. His hemoglobin was 14 and 2019 prior to these 3 hemoglobins taking during his brief hospitalization in June.    CBC Latest Ref Rng & Units 07/24/2018 07/23/2018 07/23/2018  WBC 4.0 - 10.5 K/uL 9.1 - 10.2  Hemoglobin 13.0 - 17.0 g/dL 9.6(L) 9.6(L) 8.5(L)  Hematocrit 39.0 - 52.0 % 29.1(L) 29.2(L) 26.3(L)  Platelets 150 - 400 K/uL 262 - 325     Allergies  Allergen Reactions  . Penicillins Hives, Rash and Other (See Comments)    Has patient had a PCN reaction causing immediate rash, facial/tongue/throat swelling, SOB or lightheadedness with hypotension:  Has patient had a PCN reaction causing severe rash involving mucus membranes or skin necrosis:  Has patient had a PCN reaction that required hospitalization:  Has patient had a PCN reaction occurring within the last 10 years:  If all of the above answers are "NO", then may proceed with Cephalosporin use.    Current Meds  Medication Sig  . carvedilol (COREG) 12.5 MG tablet Take 1 tablet (12.5 mg total) by mouth 2 (two) times daily.  . Cholecalciferol (VITAMIN D3) 2000 UNITS TABS Take 1 tablet by  mouth daily.  . cyanocobalamin 100 MCG tablet Take 100 mcg by mouth daily.  . cycloSPORINE modified (GENGRAF) 25 MG capsule Take 25 mg by mouth 2 (two) times daily.  Marland Kitchen docusate sodium (COLACE) 100 MG capsule Take 100 mg by mouth daily as needed for mild constipation.  Marland Kitchen losartan (COZAAR) 25 MG tablet Take 1 tablet (25 mg total) by mouth daily.  . Multiple Vitamins-Minerals (MENS MULTI VITAMIN & MINERAL PO) Take 1 tablet by mouth daily.   Past Medical History:  Diagnosis Date  . Adenomatous colon polyp 2004  . Chronic systolic congestive heart failure ([place])   . Depression   . DJD (degenerative joint disease)   . GERD with  stricture   . Implantable cardioverter-defibrillator lead failure    s/p lead revision in 2010 by Dr [name],  subsequent newly placed RV defibrillator lead has fractured.  ICD therapies programmed off 6/15  . Myasthenia gravis    mild  . Nonischemic cardiomyopathy (H[facility])    EF improved from 20% to 50% with CRT  . NSAID-induced duodenal ulcer with hemorrhage 08/27/2018  . Obesity   . Pickwickian syndrome ([place])   . Schwannoma    [facility] at [facility]  . Ulcer of esophagus with bleeding 08/27/2018   Past Surgical History:  Procedure Laterality Date  . BIOPSY  07/24/2018   Procedure: BIOPSY;  Surgeon: Doran Stabler, MD;  Location: [place];  Service: Endoscopy;;  . BIV ICD GENERTAOR CHANGE OUT Left 05/23/2011   Procedure: BIV ICD [place];  Surgeon: [name], MD;  Location: [facility] CATH LAB;  Service: Cardiovascular;  Laterality: Left;  . BIV PACEMAKER GENERATOR CHANGEOUT N/A 03/14/2017   Medtronic Percepta CRT-P MRI [place] model [id number] (SN [id number]) BiV pacemaker.  OF NOTE:  Due to partial failure of the MDT [id number] lead (RV shock/SVC shock coil fracture), I elected to place the pace/ sense portion of this lead into the LV port of the device.  The MDT [id number] was placed into the RV port on the device.  This will allow LV only pacing should the RV lead fail further.  The patient was   . CARDIAC DEFIBRILLATOR PLACEMENT  09/13/05, 12/07/2007, 05/23/11   MDT BiV ICD implanted by Dr [name], His [employer] [id number] lead fractured and was replaced by Dr [name] 12/07/2007, Generator change by Dr [name] 05/23/11  . COLONOSCOPY    . ESOPHAGOGASTRODUODENOSCOPY (EGD) WITH ESOPHAGEAL DILATION  2005  . ESOPHAGOGASTRODUODENOSCOPY (EGD) WITH PROPOFOL N/A 07/24/2018   Procedure: ESOPHAGOGASTRODUODENOSCOPY (EGD) WITH PROPOFOL;  Surgeon: Doran Stabler, MD;  Location: [facility];  Service: Endoscopy;  Laterality: N/A;  . schwannoma resection     removed at [place] Narrative    Retired, widowed 2 children   Summer 2020 moved th [place] area and is near son [name]   No EtOH, tobacco   family history includes Lung disease in his father.   Review of Systems As above  Wt Readings from Last 3 Encounters:  08/27/18 220 lb (99.8 kg)  07/24/18 190 lb 0.6 oz (86.2 kg)  07/22/18 200 lb (90.7 kg)

## 2018-08-27 NOTE — Patient Instructions (Addendum)
   I was glad to hear that you are improved and feeling better.  As we discussed, staying on pantoprazole 40 mg daily is in your best interest to heal these ulcers and prevent them from coming back.  I did not mention that you also had inflammation in your esophagus with an ulcer there that was likely from acid reflux.  You had had problems like this before and had undergone an esophageal dilation about 15 years ago so we do not want that problem to return, either.  Please purchase over-the-counter ferrous salt sulfate and take 325 mg daily. I have prescribed the pantoprazole at the [facility] in [place] as discussed.  Your new doctor should follow-up your hemoglobin and iron levels and make sure those return to normal and at that point you should be able to stop the iron.   It is okay to restart a baby aspirin if that is thought to be in your best interest overall.  Avoid taking anti-inflammatories also known as NSAIDs like you did before without a doctor's input.  Its not strictly forbidden but it needs to be managed carefully should you need to take that is.  We will mail copies of today's note, your endoscopy procedure report and pathology result, and the hospital discharge summary so that you have these to show to your new doctor or doctors in [place] as it seems like you will be living there.   I appreciate the opportunity to care for you.  Gatha Mayer, MD, [facility]

## 2018-09-15 ENCOUNTER — Encounter: Payer: Medicare Other | Admitting: *Deleted

## 2021-12-05 DEATH — deceased
